# Patient Record
Sex: Female | Born: 1975 | Race: White | Hispanic: No | Smoking: Current every day smoker
Health system: Southern US, Community
[De-identification: ages and names within clinical notes are randomized; demographics above are authoritative.]

## PROBLEM LIST (undated history)

## (undated) DIAGNOSIS — G43909 Migraine, unspecified, not intractable, without status migrainosus: Secondary | ICD-10-CM

## (undated) DIAGNOSIS — I10 Essential (primary) hypertension: Secondary | ICD-10-CM

## (undated) DIAGNOSIS — F419 Anxiety disorder, unspecified: Secondary | ICD-10-CM

## (undated) DIAGNOSIS — E7800 Pure hypercholesterolemia, unspecified: Secondary | ICD-10-CM

## (undated) DIAGNOSIS — T7840XA Allergy, unspecified, initial encounter: Secondary | ICD-10-CM

## (undated) HISTORY — PX: CHOLECYSTECTOMY: SHX55

## (undated) HISTORY — DX: Anxiety disorder, unspecified: F41.9

## (undated) HISTORY — DX: Allergy, unspecified, initial encounter: T78.40XA

---

## 2005-01-01 ENCOUNTER — Emergency Department: Payer: Self-pay | Admitting: Emergency Medicine

## 2005-05-29 ENCOUNTER — Emergency Department: Payer: Self-pay | Admitting: Emergency Medicine

## 2005-07-13 ENCOUNTER — Ambulatory Visit: Payer: Self-pay

## 2005-07-21 ENCOUNTER — Emergency Department: Payer: Self-pay | Admitting: General Practice

## 2005-09-11 ENCOUNTER — Observation Stay: Payer: Self-pay | Admitting: Unknown Physician Specialty

## 2005-09-15 ENCOUNTER — Observation Stay: Payer: Self-pay | Admitting: Unknown Physician Specialty

## 2005-10-07 ENCOUNTER — Observation Stay: Payer: Self-pay | Admitting: Unknown Physician Specialty

## 2005-10-08 ENCOUNTER — Inpatient Hospital Stay: Payer: Self-pay | Admitting: Unknown Physician Specialty

## 2006-06-12 ENCOUNTER — Emergency Department: Payer: Self-pay

## 2006-06-16 ENCOUNTER — Emergency Department: Payer: Self-pay | Admitting: Emergency Medicine

## 2006-06-17 ENCOUNTER — Ambulatory Visit: Payer: Self-pay | Admitting: Emergency Medicine

## 2006-06-17 ENCOUNTER — Observation Stay: Payer: Self-pay | Admitting: Vascular Surgery

## 2006-06-17 ENCOUNTER — Emergency Department: Payer: Self-pay | Admitting: Unknown Physician Specialty

## 2006-07-02 ENCOUNTER — Ambulatory Visit: Payer: Self-pay | Admitting: Vascular Surgery

## 2007-09-21 ENCOUNTER — Emergency Department: Payer: Self-pay | Admitting: Emergency Medicine

## 2008-06-20 ENCOUNTER — Emergency Department: Payer: Self-pay | Admitting: Internal Medicine

## 2009-12-13 ENCOUNTER — Emergency Department: Payer: Self-pay | Admitting: Emergency Medicine

## 2009-12-31 ENCOUNTER — Emergency Department: Payer: Self-pay | Admitting: Unknown Physician Specialty

## 2011-10-01 ENCOUNTER — Emergency Department: Payer: Self-pay | Admitting: *Deleted

## 2011-10-01 LAB — URINALYSIS, COMPLETE
Glucose,UR: NEGATIVE mg/dL (ref 0–75)
Leukocyte Esterase: NEGATIVE
Ph: 7 (ref 4.5–8.0)
Protein: NEGATIVE
RBC,UR: 1 /HPF (ref 0–5)
Squamous Epithelial: 1

## 2011-10-01 LAB — CBC
HCT: 46 % (ref 35.0–47.0)
HGB: 15.1 g/dL (ref 12.0–16.0)
MCHC: 32.8 g/dL (ref 32.0–36.0)
MCV: 88 fL (ref 80–100)
RBC: 5.23 10*6/uL — ABNORMAL HIGH (ref 3.80–5.20)

## 2012-04-25 ENCOUNTER — Emergency Department: Payer: Self-pay | Admitting: Unknown Physician Specialty

## 2012-04-25 LAB — URINALYSIS, COMPLETE
Leukocyte Esterase: NEGATIVE
Nitrite: NEGATIVE
Ph: 5 (ref 4.5–8.0)
Protein: NEGATIVE
RBC,UR: 1 /HPF (ref 0–5)

## 2012-04-26 LAB — COMPREHENSIVE METABOLIC PANEL
Albumin: 3.2 g/dL — ABNORMAL LOW (ref 3.4–5.0)
Alkaline Phosphatase: 74 U/L (ref 50–136)
Anion Gap: 9 (ref 7–16)
BUN: 7 mg/dL (ref 7–18)
Bilirubin,Total: 0.3 mg/dL (ref 0.2–1.0)
Chloride: 109 mmol/L — ABNORMAL HIGH (ref 98–107)
Glucose: 100 mg/dL — ABNORMAL HIGH (ref 65–99)
Osmolality: 281 (ref 275–301)
Potassium: 3.3 mmol/L — ABNORMAL LOW (ref 3.5–5.1)
SGOT(AST): 19 U/L (ref 15–37)
Sodium: 142 mmol/L (ref 136–145)
Total Protein: 7 g/dL (ref 6.4–8.2)

## 2012-04-26 LAB — CBC
HGB: 11.9 g/dL — ABNORMAL LOW (ref 12.0–16.0)
MCH: 30.1 pg (ref 26.0–34.0)
MCV: 87 fL (ref 80–100)
Platelet: 139 10*3/uL — ABNORMAL LOW (ref 150–440)
RBC: 3.95 10*6/uL (ref 3.80–5.20)
RDW: 13.3 % (ref 11.5–14.5)

## 2012-06-13 ENCOUNTER — Observation Stay: Payer: Self-pay

## 2012-06-13 LAB — URINALYSIS, COMPLETE
Blood: NEGATIVE
Nitrite: NEGATIVE
Protein: NEGATIVE
Specific Gravity: 1.012 (ref 1.003–1.030)

## 2012-08-06 ENCOUNTER — Emergency Department: Payer: Self-pay | Admitting: Emergency Medicine

## 2012-08-23 ENCOUNTER — Observation Stay: Payer: Self-pay | Admitting: Obstetrics and Gynecology

## 2012-08-23 ENCOUNTER — Inpatient Hospital Stay: Payer: Self-pay | Admitting: Internal Medicine

## 2012-08-23 LAB — CBC WITH DIFFERENTIAL/PLATELET
Basophil #: 0 10*3/uL (ref 0.0–0.1)
Eosinophil #: 0.1 10*3/uL (ref 0.0–0.7)
Eosinophil %: 0.6 %
HCT: 32.4 % — ABNORMAL LOW (ref 35.0–47.0)
HGB: 10.9 g/dL — ABNORMAL LOW (ref 12.0–16.0)
Lymphocyte #: 3 10*3/uL (ref 1.0–3.6)
Lymphocyte %: 25.2 %
MCHC: 33.6 g/dL (ref 32.0–36.0)
Monocyte %: 5.8 %
Neutrophil #: 8 10*3/uL — ABNORMAL HIGH (ref 1.4–6.5)
Neutrophil %: 68.2 %
RBC: 3.71 10*6/uL — ABNORMAL LOW (ref 3.80–5.20)
RDW: 13.2 % (ref 11.5–14.5)
WBC: 11.7 10*3/uL — ABNORMAL HIGH (ref 3.6–11.0)

## 2012-08-25 LAB — HEMATOCRIT: HCT: 31.8 % — ABNORMAL LOW (ref 35.0–47.0)

## 2014-12-25 NOTE — Op Note (Signed)
PATIENT NAME:  Crystal Freeman, Crystal MR#:  [id number] DATE OF BIRTH:  1976-05-04  DATE OF PROCEDURE:  08/23/2012   PREOPERATIVE DIAGNOSES:   1.  Prior cesarean section.  2.  Spontaneous rupture of membranes,   POSTOPERATIVE DIAGNOSES: 1.  Prior caesarean section. 2.  Spontaneous rupture of membranes.  PROCEDURE PERFORMED: 1.  Repeat low transverse cesarean section. 2.  Bilateral partial salpingectomy.   SURGEON: Loman Logan A. Patton SallesWeaver Lee, M.D.   ASSISTANT: Creed CopperJamie Doby, scrub tech.   ESTIMATED BLOOD LOSS: 500 mL.  OPERATIVE FLUIDS: 2 liters.   COMPLICATIONS: None.   FINDINGS: Vertex female infant, 3430 grams, Apgars 9 and 9, normal uterus, tubes, and ovaries.   SPECIMEN: Portion of right and left tube.   INDICATIONS: The patient is a 39 year old with a history of prior cesarean sections who presents with spontaneous rupture of membranes.  The decision was made to proceed towards repeat cesarean section for delivery. Risks, benefits and alternatives of the procedure were explained and informed consent was obtained.   PROCEDURE IN DETAIL: The patient was taken to the Operating Room with IV fluids running. She was prepped and draped in the usual sterile fashion with a leftward tilt. Pfannenstiel skin incision, carried down to the umbilical fascia with a knife. The fascia was nicked in the midline. The incision was extended laterally. The superior aspect of the fascia was grasped with Kochers and the underlying rectus muscles were dissected off. This was repeated on the inferior fascia. The rectus muscles were divided midline using a Kelly clamp. The peritoneum was entered bluntly. The opening was extended. A bladder blade was placed and the vesicouterine peritoneum was grasped with a rat-tooth pick-up and entered sharply with the Metzenbaum scissors. The bladder flap was created digitally. The hysterotomy incision was made and carried down to underlying fetal membranes which were ruptured. The opening  was extended. The infant's head was grasped and delivered atraumatically through the hysterotomy incision. Nuchal cord x 1 was reduced. The mouth and nose were bulb suctioned. The anterior and posterior shoulders were delivered followed by the remainder of the body. Cord was clamped x 2 and cut and the infant was handed to the awaiting neonatologist. The placenta was expressed. The uterus was exteriorized and cleared of all clot and debris. The hysterotomy incision was repaired with a 0-Monocryl in a running locked fashion. The left tube was grasped with a Babcock and then an opening made in the avascular window in the mesosalpinx using the Bovie cautery. Two pieces of plain gut suture were passed through the opening. The tube was tied 2 to 3 cm lateral to the uterine cornu. The tube was cut. The cut edges were made hemostatic. This was repeated on the right tube. The uterus was returned to the abdomen. The abdomen and gutters were irrigated with copious amounts of warm normal saline. The peritoneum was repaired with 2-0 Vicryl. The On-Q apparatus was placed according to manufacturer's instructions and the fascia was repaired with 1-PDS.  The skin was closed with the Insorb stapler.  The On-Q pump catheters were bolused with 5 mL each  0.5% Sensorcaine plain. The catheters were secured to the abdomen using SteriStrips and Tegaderm. The patient tolerated the procedure well. Sponge, needle and instrument counts were correct x 2. The patient was taken to the recovery room in stable condition.  ____________________________ Sonda PrimesLashawn A. Patton SallesWeaver-Lee, MD law:eg D: 08/24/2012 01:12:20 ET T: 08/25/2012 15:59:31 ET JOB#: [id number]  cc: Flint MelterLashawn A. Patton SallesWeaver-Lee, MD, <Dictator> Lummie Montijo A WEAVER LEE  MD ELECTRONICALLY SIGNED 09/14/2012 4:35

## 2015-01-12 NOTE — H&P (Signed)
L&D Evaluation:  History:   HPI 39 yo G3P1011 @ 37.2wks EDC 09/11/12 by LMP presents with LOF.  She was seen earlier today for the same and was nitrazine neg.  About 4 hrs after d/c, she had a slow trickle then a gush of fluid.  Pt has had a prev C-section.  Pregnancy otherwise uncomplicated.  Nitrazine was pos.    Presents with leaking fluid    Patient's Medical History No Chronic Illness    Patient's Surgical History Colecystectomy  Previous C-Section  Hernia repair    Medications Pre Natal Vitamins    Allergies Sulfa    Family History Non-Contributory   ROS:   ROS see HPI, all others neg   Exam:   Vital Signs stable    Urine Protein not completed    General no apparent distress    Mental Status clear    Chest clear    Heart normal sinus rhythm    Abdomen gravid, non-tender    Edema no edema    Pelvic +pool, nitrazine and fern    Mebranes Ruptured    Description clear    FHT normal rate with no decels    Fetal Heart Rate 135    Ucx irregular    Skin dry   Impression:   Impression SROM @ term, prev C-section, desires sterility   Plan:   Comments Repeat C-section when labs return.  Risks discussed.  Pt desires tubal.   Electronic Signatures: Senaida LangeWeaver-Lee, Yarlin Breisch (MD)  (Signed 20-Dec-13 22:51)  Authored: L&D Evaluation   Last Updated: 20-Dec-13 22:51 by Senaida LangeWeaver-Lee, Fenris Cauble (MD)

## 2015-01-12 NOTE — H&P (Signed)
L&D Evaluation:  History Expanded:   HPI 39 yo G3P1011 with EDD of 09/11/12 per LMP & 8 week US, presents at 27 2/7 weeks with c/o vomiting x 6 overnight and this am and occasional abdominal pain. Pt last tried po intake of water about 5:30 this am after which she vomited. Has not had any more emesis or tried po again since then. Denies ROM, VB or decreased FM.    Blood Type (Maternal) A positive    Group B Strep Results Maternal (Result >5wks must be treated as unknown) unknown/result > 5 weeks ago    Maternal HIV Negative    Maternal Syphilis Ab Nonreactive    Maternal Varicella Immune    Rubella Results (Maternal) immune    Patient's Medical History No Chronic Illness    Patient's Surgical History Colecystectomy    Medications Pre Natal Vitamins    Allergies Sulfa    Social History tobacco   ROS:   ROS see HPI   Exam:   Vital Signs stable    General no apparent distress    Mental Status clear    Chest clear    Heart no murmur/gallop/rubs    Abdomen gravid, non-tender    Edema no edema    Pelvic no external lesions, cervix closed and thick    Mebranes Intact    FHT normal rate with no decels, appropriate for gestational age    Ucx absent   Impression:   Impression N/V   Plan:   Plan UA    Comments IV fluids IV zofran and phenergan Po trial when tolerated   Electronic Signatures: Jaece Ducharme, Marta Lamasamara K (CNM)  (Signed 10-Oct-13 12:39)  Authored: L&D Evaluation   Last Updated: 10-Oct-13 12:39 by Vella KohlerBrothers, Ishitha Roper K (CNM)

## 2019-08-08 ENCOUNTER — Ambulatory Visit
Admission: EM | Admit: 2019-08-08 | Discharge: 2019-08-08 | Disposition: A | Discharge status: Home / Self Care | Payer: BC Managed Care – PPO | Attending: Family Medicine | Admitting: Family Medicine

## 2019-08-08 ENCOUNTER — Ambulatory Visit (INDEPENDENT_AMBULATORY_CARE_PROVIDER_SITE_OTHER): Payer: BC Managed Care – PPO

## 2019-08-08 ENCOUNTER — Other Ambulatory Visit: Payer: Self-pay

## 2019-08-08 ENCOUNTER — Encounter: Payer: Self-pay | Admitting: Emergency Medicine

## 2019-08-08 DIAGNOSIS — M545 Low back pain: Secondary | ICD-10-CM

## 2019-08-08 DIAGNOSIS — S39012A Strain of muscle, fascia and tendon of lower back, initial encounter: Secondary | ICD-10-CM | POA: Diagnosis not present

## 2019-08-08 HISTORY — DX: Migraine, unspecified, not intractable, without status migrainosus: G43.909

## 2019-08-08 MED ORDER — CYCLOBENZAPRINE HCL 10 MG PO TABS: 10.0000 mg | ORAL_TABLET | Freq: Every day | ORAL | 0 refills | Status: DC

## 2019-08-08 MED ORDER — MELOXICAM 15 MG PO TABS: 15.0000 mg | ORAL_TABLET | Freq: Every day | ORAL | 0 refills | Status: DC

## 2019-08-08 MED ORDER — HYDROCODONE-ACETAMINOPHEN 5-325 MG PO TABS: ORAL_TABLET | ORAL | 0 refills | Status: DC
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
## 2019-08-08 NOTE — Discharge Instructions (Addendum)
Rest,heat, easy gentle stretching, tylenol as needed
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
## 2021-11-29 ENCOUNTER — Ambulatory Visit: Payer: Self-pay | Admitting: Internal Medicine

## 2021-12-09 ENCOUNTER — Encounter: Payer: Self-pay | Admitting: Internal Medicine

## 2021-12-09 ENCOUNTER — Ambulatory Visit (INDEPENDENT_AMBULATORY_CARE_PROVIDER_SITE_OTHER): Payer: BC Managed Care – PPO | Admitting: Internal Medicine

## 2021-12-09 DIAGNOSIS — F411 Generalized anxiety disorder: Secondary | ICD-10-CM | POA: Diagnosis not present

## 2021-12-09 DIAGNOSIS — E66811 Obesity, class 1: Secondary | ICD-10-CM | POA: Insufficient documentation

## 2021-12-09 DIAGNOSIS — R030 Elevated blood-pressure reading, without diagnosis of hypertension: Secondary | ICD-10-CM | POA: Insufficient documentation

## 2021-12-09 DIAGNOSIS — E6609 Other obesity due to excess calories: Secondary | ICD-10-CM | POA: Diagnosis not present

## 2021-12-09 DIAGNOSIS — Z6831 Body mass index (BMI) 31.0-31.9, adult: Secondary | ICD-10-CM

## 2021-12-09 DIAGNOSIS — G43909 Migraine, unspecified, not intractable, without status migrainosus: Secondary | ICD-10-CM | POA: Insufficient documentation

## 2021-12-09 DIAGNOSIS — G43C1 Periodic headache syndromes in child or adult, intractable: Secondary | ICD-10-CM

## 2021-12-09 MED ORDER — SUMATRIPTAN SUCCINATE 25 MG PO TABS: ORAL_TABLET | ORAL | 5 refills | Status: DC

## 2021-12-09 NOTE — Assessment & Plan Note (Signed)
Encourage diet and exercise for weight loss 

## 2021-12-09 NOTE — Assessment & Plan Note (Signed)
Try to identify triggers and avoid them ?Continue Imitrex as needed, refilled today ?

## 2021-12-09 NOTE — Assessment & Plan Note (Signed)
Persistent but she is not interested in medication therapy at this time ?She declines referral to a therapist as well at this time ?Support offered ?

## 2021-12-09 NOTE — Patient Instructions (Signed)

## 2021-12-09 NOTE — Progress Notes (Signed)
HPI ? ?Patient presents to clinic today to establish care and for management of the conditions listed below. ? ?Migraines: Triggered by hormones, lack of caffeine, periods of time without eating.  These occur rarely.  She is taking Imitrex as needed with good relief of symptoms.  She does not follow with neurology. ? ?GAD: Triggered by dealing with her ex husband, and she has been dealing with some child custody. She is not currently seeing a therapist but she has seen one in the past. She does not feel depressed. She has been on Fluoxetine in the past which worked well but she would like to try to stay off medication if she could. She denies SI/HI. ? ?Of note, her BP today is 136/92. She has never been told she has HTN in the past. She feels like this is related to stress and anxiety. She has never taken antihypertensive medications in the past. ? ?Past Medical History:  ?Diagnosis Date  ? Migraine   ? ? ?Current Outpatient Medications  ?Medication Sig Dispense Refill  ? cyclobenzaprine (FLEXERIL) 10 MG tablet Take 1 tablet (10 mg total) by mouth at bedtime. 30 tablet 0  ? HYDROcodone-acetaminophen (NORCO/VICODIN) 5-325 MG tablet 1-2 tabs po qd prn 6 tablet 0  ? meloxicam (MOBIC) 15 MG tablet Take 1 tablet (15 mg total) by mouth daily. 30 tablet 0  ? naproxen (NAPROSYN) 500 MG tablet Take 500 mg by mouth 2 (two) times daily as needed for headache.    ? norethindrone (AYGESTIN) 5 MG tablet Take 5 mg by mouth daily.    ? SUMAtriptan (IMITREX) 25 MG tablet TAKE 1 TABLET BY MOUTH ONCE AS NEEDED FOR MIGRAINE FOR UP TO 1 DOSE MAY TAKE SECOND DOSE AFTER 2 HOURS IF NEEDED.    ? ?No current facility-administered medications for this visit.  ? ? ?Allergies  ?Allergen Reactions  ? Statins   ?  Other reaction(s): Muscle Pain  ? Sulfa Antibiotics Rash  ? ? ?Family History  ?Problem Relation Age of Onset  ? COPD Mother   ? Heart failure Mother   ? Diabetes Mother   ? Hypertension Mother   ? Hyperlipidemia Mother   ? Hypertension  Father   ? Hyperlipidemia Father   ? ? ?Social History  ? ?Socioeconomic History  ? Marital status: Divorced  ?  Spouse name: Not on file  ? Number of children: Not on file  ? Years of education: Not on file  ? Highest education level: Not on file  ?Occupational History  ? Not on file  ?Tobacco Use  ? Smoking status: Every Day  ?  Packs/day: 0.75  ?  Years: 20.00  ?  Pack years: 15.00  ?  Types: Cigarettes  ? Smokeless tobacco: Never  ?Vaping Use  ? Vaping Use: Never used  ?Substance and Sexual Activity  ? Alcohol use: Never  ? Drug use: Never  ? Sexual activity: Not Currently  ?Other Topics Concern  ? Not on file  ?Social History Narrative  ? Not on file  ? ?Social Determinants of Health  ? ?Financial Resource Strain: Not on file  ?Food Insecurity: Not on file  ?Transportation Needs: Not on file  ?Physical Activity: Not on file  ?Stress: Not on file  ?Social Connections: Not on file  ?Intimate Partner Violence: Not on file  ? ? ?ROS: ? ?Constitutional: Patient reports intermittent headaches.  Denies fever, malaise, fatigue, or abrupt weight changes.  ?HEENT: Denies eye pain, eye redness, ear pain, ringing in  the ears, wax buildup, runny nose, nasal congestion, bloody nose, or sore throat. ?Respiratory: Denies difficulty breathing, shortness of breath, cough or sputum production.   ?Cardiovascular: Denies chest pain, chest tightness, palpitations or swelling in the hands or feet.  ?Gastrointestinal: Denies abdominal pain, bloating, constipation, diarrhea or blood in the stool.  ?GU: Denies frequency, urgency, pain with urination, blood in urine, odor or discharge. ?Musculoskeletal: Denies decrease in range of motion, difficulty with gait, muscle pain or joint pain and swelling.  ?Skin: Denies redness, rashes, lesions or ulcercations.  ?Neurological: Denies dizziness, difficulty with memory, difficulty with speech or problems with balance and coordination.  ?Psych: Pt reports anxiety. Denies depression, SI/HI. ? ?No  other specific complaints in a complete review of systems (except as listed in HPI above). ? ?PE: ?BP (!) 136/92 (BP Location: Right Arm, Patient Position: Sitting, Cuff Size: Large)   Pulse 85   Temp (!) 97.5 ?F (36.4 ?C) (Temporal)   Ht 5\' 7"  (1.702 m)   Wt 201 lb (91.2 kg)   SpO2 99%   BMI 31.48 kg/m?  ? ?Wt Readings from Last 3 Encounters:  ?08/08/19 183 lb (83 kg)  ? ? ?General: Appears her stated age, obese, in NAD. ?HEENT: Head: normal shape and size; Eyes: sclera white,  PERRLA and EOMs intact;  ?Cardiovascular: Normal rate and rhythm. S1,S2 noted.  No murmur, rubs or gallops noted. No JVD or BLE edema. ?Pulmonary/Chest: Normal effort and positive vesicular breath sounds. No respiratory distress. No wheezes, rales or ronchi noted.  ?Musculoskeletal: No difficulty with gait.  ?Neurological: Alert and oriented.  ?Psychiatric: Mood and affect normal. Tearful. Judgment and thought content normal.  ? ? ? ?BMET ?   ?Component Value Date/Time  ? NA 142 04/26/2012 0123  ? K 3.3 (L) 04/26/2012 0123  ? CL 109 (H) 04/26/2012 0123  ? CO2 24 04/26/2012 0123  ? GLUCOSE 100 (H) 04/26/2012 0123  ? BUN 7 04/26/2012 0123  ? CREATININE 0.68 04/26/2012 0123  ? CALCIUM 9.3 04/26/2012 0123  ? GFRNONAA >60 04/26/2012 0123  ? GFRAA >60 04/26/2012 0123  ? ? ?Lipid Panel  ?No results found for: CHOL, TRIG, HDL, CHOLHDL, VLDL, LDLCALC ? ?CBC ?   ?Component Value Date/Time  ? WBC 11.7 (H) 08/23/2012 2248  ? RBC 3.71 (L) 08/23/2012 2248  ? HGB 10.9 (L) 08/23/2012 2248  ? HCT 31.8 (L) 08/25/2012 0715  ? PLT 148 (L) 08/23/2012 2248  ? MCV 88 08/23/2012 2248  ? MCH 29.4 08/23/2012 2248  ? MCHC 33.6 08/23/2012 2248  ? RDW 13.2 08/23/2012 2248  ? LYMPHSABS 3.0 08/23/2012 2248  ? MONOABS 0.7 08/23/2012 2248  ? EOSABS 0.1 08/23/2012 2248  ? BASOSABS 0.0 08/23/2012 2248  ? ? ?Hgb A1C ?No results found for: HGBA1C ? ? ?Assessment and Plan: ? ? ?08/25/2012, NP ? ?

## 2021-12-09 NOTE — Assessment & Plan Note (Signed)
Elevated today but she feels this is stress related ?Reinforced DASH diet and exercise for weight loss ? ?RTC in 1 month for annual exam and BP check ?

## 2022-01-06 ENCOUNTER — Encounter: Payer: BC Managed Care – PPO | Admitting: Internal Medicine

## 2022-01-19 ENCOUNTER — Ambulatory Visit (INDEPENDENT_AMBULATORY_CARE_PROVIDER_SITE_OTHER): Payer: BC Managed Care – PPO | Admitting: Internal Medicine

## 2022-01-19 ENCOUNTER — Other Ambulatory Visit (HOSPITAL_COMMUNITY)
Admission: RE | Admit: 2022-01-19 | Discharge: 2022-01-19 | Disposition: A | Discharge status: Home / Self Care | Payer: BC Managed Care – PPO | Source: Ambulatory Visit | Attending: Internal Medicine | Admitting: Internal Medicine

## 2022-01-19 ENCOUNTER — Encounter: Payer: Self-pay | Admitting: Internal Medicine

## 2022-01-19 VITALS — BP 138/86 | HR 87 | Temp 97.8°F | Ht 67.0 in | Wt 200.0 lb

## 2022-01-19 DIAGNOSIS — Z6831 Body mass index (BMI) 31.0-31.9, adult: Secondary | ICD-10-CM

## 2022-01-19 DIAGNOSIS — Z0001 Encounter for general adult medical examination with abnormal findings: Secondary | ICD-10-CM

## 2022-01-19 DIAGNOSIS — Z124 Encounter for screening for malignant neoplasm of cervix: Secondary | ICD-10-CM | POA: Diagnosis not present

## 2022-01-19 DIAGNOSIS — Z1159 Encounter for screening for other viral diseases: Secondary | ICD-10-CM

## 2022-01-19 DIAGNOSIS — Z1231 Encounter for screening mammogram for malignant neoplasm of breast: Secondary | ICD-10-CM

## 2022-01-19 DIAGNOSIS — Z114 Encounter for screening for human immunodeficiency virus [HIV]: Secondary | ICD-10-CM | POA: Diagnosis not present

## 2022-01-19 DIAGNOSIS — E6609 Other obesity due to excess calories: Secondary | ICD-10-CM

## 2022-01-19 DIAGNOSIS — R030 Elevated blood-pressure reading, without diagnosis of hypertension: Secondary | ICD-10-CM

## 2022-01-19 NOTE — Assessment & Plan Note (Signed)
Borderline Reinforced DASH diet and exercise for weight loss 

## 2022-01-19 NOTE — Assessment & Plan Note (Signed)
Encourage diet and exercise for weight loss 

## 2022-01-19 NOTE — Progress Notes (Signed)
Subjective:    Patient ID: Crystal Freeman, female    DOB: Apr 24, 1976, 46 y.o.   MRN: [id number]  HPI  Patient presents to clinic today for her annual exam.  Flu: 06/2021 Tetanus: 06/2017 Pneumovax: 07/2017 COVID: Alphonsa Overall x1 Pap smear: > 5 years ago Mammogram: never Vision screening: as needed Dentist: biannually  Diet: She does eat meat. She consumes fruits and veggies. She tries to avoid fried foods. She drinks some water, coffee, sweet tea Exercise: None  Review of Systems     Past Medical History:  Diagnosis Date   Migraine     Current Outpatient Medications  Medication Sig Dispense Refill   acetaminophen (TYLENOL) 500 MG tablet Take by mouth.     norethindrone (AYGESTIN) 5 MG tablet Take 5 mg by mouth daily.     SUMAtriptan (IMITREX) 25 MG tablet TAKE 1 TABLET BY MOUTH ONCE AS NEEDED FOR MIGRAINE FOR UP TO 1 DOSE MAY TAKE SECOND DOSE AFTER 2 HOURS IF NEEDED. 10 tablet 5   No current facility-administered medications for this visit.    Allergies  Allergen Reactions   Statins     Other reaction(s): Muscle Pain   Sulfa Antibiotics Rash    Family History  Problem Relation Age of Onset   COPD Mother    Heart failure Mother    Diabetes Mother    Hypertension Mother    Depression Mother    Hypertension Father    Hyperlipidemia Father    Healthy Sister    Cancer - Colon Neg Hx    Breast cancer Neg Hx    Ovarian cancer Neg Hx     Social History   Socioeconomic History   Marital status: Divorced    Spouse name: Not on file   Number of children: Not on file   Years of education: Not on file   Highest education level: Not on file  Occupational History   Not on file  Tobacco Use   Smoking status: Every Day    Packs/day: 0.75    Years: 20.00    Pack years: 15.00    Types: Cigarettes   Smokeless tobacco: Never  Vaping Use   Vaping Use: Never used  Substance and Sexual Activity   Alcohol use: Never   Drug use: Never   Sexual activity: Not  Currently  Other Topics Concern   Not on file  Social History Narrative   Not on file   Social Determinants of Health   Financial Resource Strain: Not on file  Food Insecurity: Not on file  Transportation Needs: Not on file  Physical Activity: Not on file  Stress: Not on file  Social Connections: Not on file  Intimate Partner Violence: Not on file     Constitutional: Patient reports intermittent headaches.  Denies fever, malaise, fatigue, or abrupt weight changes.  HEENT: Denies eye pain, eye redness, ear pain, ringing in the ears, wax buildup, runny nose, nasal congestion, bloody nose, or sore throat. Respiratory: Denies difficulty breathing, shortness of breath, cough or sputum production.   Cardiovascular: Denies chest pain, chest tightness, palpitations or swelling in the hands or feet.  Gastrointestinal: Denies abdominal pain, bloating, constipation, diarrhea or blood in the stool.  GU: Denies urgency, frequency, pain with urination, burning sensation, blood in urine, odor or discharge. Musculoskeletal: Denies decrease in range of motion, difficulty with gait, muscle pain or joint pain and swelling.  Skin: Denies redness, rashes, lesions or ulcercations.  Neurological: Denies dizziness, difficulty with memory, difficulty with speech  or problems with balance and coordination.  Psych: Patient reports anxiety.  Denies depression, SI/HI.  No other specific complaints in a complete review of systems (except as listed in HPI above).  Objective:   Physical Exam  BP 138/86 (BP Location: Left Arm, Patient Position: Sitting, Cuff Size: Large)   Pulse 87   Temp 97.8 F (36.6 C) (Temporal)   Ht 5' 7" (1.702 m)   Wt 200 lb (90.7 kg)   SpO2 100%   BMI 31.32 kg/m   Wt Readings from Last 3 Encounters:  12/09/21 201 lb (91.2 kg)  08/08/19 183 lb (83 kg)    General: Appears her stated age, obese, in NAD. Skin: Warm, dry and intact.  HEENT: Head: normal shape and size; Eyes:  sclera white, no icterus, conjunctiva pink, PERRLA and EOMs intact;  Neck:  Neck supple, trachea midline. No masses, lumps or thyromegaly present.  Cardiovascular: Normal rate and rhythm. S1,S2 noted.  No murmur, rubs or gallops noted. No JVD or BLE edema. Pulmonary/Chest: Normal effort and positive vesicular breath sounds. No respiratory distress. No wheezes, rales or ronchi noted.  Abdomen: Soft and nontender. Normal bowel sounds. No distention or masses noted. Liver, spleen and kidneys non palpable. Pelvic: Normal female anatomy.  Cervix with reoperative changes noted from 10-12 o'clock.  Small amount of yellow discharge noted from the cervical os.  No CMT.  Adnexa nonpalpable. Musculoskeletal: Strength 5/5 BUE/BLE.  No difficulty with gait.  Neurological: Alert and oriented. Cranial nerves II-XII grossly intact. Coordination normal.  Psychiatric: Mood and affect normal. Behavior is normal. Judgment and thought content normal.    BMET    Component Value Date/Time   NA 142 04/26/2012 0123   K 3.3 (L) 04/26/2012 0123   CL 109 (H) 04/26/2012 0123   CO2 24 04/26/2012 0123   GLUCOSE 100 (H) 04/26/2012 0123   BUN 7 04/26/2012 0123   CREATININE 0.68 04/26/2012 0123   CALCIUM 9.3 04/26/2012 0123   GFRNONAA >60 04/26/2012 0123   GFRAA >60 04/26/2012 0123    Lipid Panel  No results found for: CHOL, TRIG, HDL, CHOLHDL, VLDL, LDLCALC  CBC    Component Value Date/Time   WBC 11.7 (H) 08/23/2012 2248   RBC 3.71 (L) 08/23/2012 2248   HGB 10.9 (L) 08/23/2012 2248   HCT 31.8 (L) 08/25/2012 0715   PLT 148 (L) 08/23/2012 2248   MCV 88 08/23/2012 2248   MCH 29.4 08/23/2012 2248   MCHC 33.6 08/23/2012 2248   RDW 13.2 08/23/2012 2248   LYMPHSABS 3.0 08/23/2012 2248   MONOABS 0.7 08/23/2012 2248   EOSABS 0.1 08/23/2012 2248   BASOSABS 0.0 08/23/2012 2248    Hgb A1C No results found for: HGBA1C          Assessment & Plan:   Preventative Health Maintenance:  Encouraged her to get  a flu shot in fall Tetanus UTD Pneumovax UTD Encouraged her to get a COVID booster Pap smear Mammogram ordered-she will call to schedule Encouraged her to consume a balanced diet and exercise regimen Advised him to see an eye doctor and dentist annually We will check CBC, c-Met, lipid, A1c, HIV and hep C today  RTC in 6 months, follow-up chronic conditions Regina Baity, NP  

## 2022-01-19 NOTE — Patient Instructions (Signed)

## 2022-01-20 ENCOUNTER — Telehealth: Payer: Self-pay

## 2022-01-20 DIAGNOSIS — E782 Mixed hyperlipidemia: Secondary | ICD-10-CM

## 2022-01-20 NOTE — Telephone Encounter (Signed)
-----   Message from Crystal Munroe, NP sent at 01/20/2022 10:45 AM EDT ----- WBC count, RBC count, hemoglobin and hematocrit are elevated.  This is likely due to her smoking and she should cut back on this.  The risk of this being increased is that it can lead to blood clots.  Cholesterol is elevated.  I would recommend cholesterol-lowering medication at this time.  Let me know if she is agreeable and I will send this in.  Reinforced low saturated fat diet and increase aerobic exercise.  Liver and kidney function is normal.  She does not have diabetes.

## 2022-01-20 NOTE — Telephone Encounter (Signed)
Pt advised.  She agreed to start a cholesterol medicine.  Please sent to [facility].   Thanks,   -Vernona Rieger

## 2022-01-22 NOTE — Telephone Encounter (Cosign Needed)
I see she has an allergy to statins. Just wanted to let her know that I was going to send in a statin. What statin has she tried in the past. Is she willing to try another one?

## 2022-01-23 ENCOUNTER — Encounter: Payer: Self-pay | Admitting: Internal Medicine

## 2022-01-23 LAB — COMPLETE METABOLIC PANEL WITH GFR
AG Ratio: 1.6 (calc) (ref 1.0–2.5)
ALT: 11 U/L (ref 6–29)
AST: 14 U/L (ref 10–30)
Albumin: 4.4 g/dL (ref 3.6–5.1)
Alkaline phosphatase (APISO): 63 U/L (ref 31–125)
BUN: 10 mg/dL (ref 7–25)
CO2: 25 mmol/L (ref 20–32)
Calcium: 9.1 mg/dL (ref 8.6–10.2)
Chloride: 107 mmol/L (ref 98–110)
Creat: 0.92 mg/dL (ref 0.50–0.99)
Globulin: 2.7 g/dL (calc) (ref 1.9–3.7)
Glucose, Bld: 82 mg/dL (ref 65–99)
Potassium: 4.1 mmol/L (ref 3.5–5.3)
Sodium: 141 mmol/L (ref 135–146)
Total Bilirubin: 0.5 mg/dL (ref 0.2–1.2)
Total Protein: 7.1 g/dL (ref 6.1–8.1)
eGFR: 79 mL/min/{1.73_m2} (ref >=60)

## 2022-01-23 LAB — HEMOGLOBIN A1C
Hgb A1c MFr Bld: 5.3 % of total Hgb (ref <5.7)
Mean Plasma Glucose: 105 mg/dL
eAG (mmol/L): 5.8 mmol/L

## 2022-01-23 LAB — LIPID PANEL
Cholesterol: 208 mg/dL — ABNORMAL HIGH (ref <200)
HDL: 25 mg/dL — ABNORMAL LOW (ref >=50)
LDL Cholesterol (Calc): 158 mg/dL (calc) — ABNORMAL HIGH
Non-HDL Cholesterol (Calc): 183 mg/dL (calc) — ABNORMAL HIGH (ref <130)
Total CHOL/HDL Ratio: 8.3 (calc) — ABNORMAL HIGH (ref <5.0)
Triglycerides: 129 mg/dL (ref <150)

## 2022-01-23 LAB — CBC
HCT: 49.1 % — ABNORMAL HIGH (ref 35.0–45.0)
Hemoglobin: 16.6 g/dL — ABNORMAL HIGH (ref 11.7–15.5)
MCH: 30.6 pg (ref 27.0–33.0)
MCHC: 33.8 g/dL (ref 32.0–36.0)
MCV: 90.4 fL (ref 80.0–100.0)
MPV: 11.9 fL (ref 7.5–12.5)
Platelets: 188 10*3/uL (ref 140–400)
RBC: 5.43 10*6/uL — ABNORMAL HIGH (ref 3.80–5.10)
RDW: 12.4 % (ref 11.0–15.0)
WBC: 11.2 10*3/uL — ABNORMAL HIGH (ref 3.8–10.8)

## 2022-01-23 LAB — HEPATITIS C ANTIBODY
Hepatitis C Ab: NONREACTIVE
SIGNAL TO CUT-OFF: 0.18 (ref <1.00)

## 2022-01-23 LAB — HIV ANTIBODY (ROUTINE TESTING W REFLEX): HIV 1&2 Ab, 4th Generation: NONREACTIVE

## 2022-01-23 NOTE — Telephone Encounter (Signed)
See telephone encounter   Thanks,   -Kizzi Overbey  

## 2022-01-23 NOTE — Telephone Encounter (Signed)
Pt has thinks she tried pravastatin and simvastatin.  She is willing to try a different one.    Thanks,   -Vernona Rieger

## 2022-01-24 ENCOUNTER — Encounter: Payer: Self-pay | Admitting: Internal Medicine

## 2022-01-24 ENCOUNTER — Telehealth: Payer: Self-pay

## 2022-01-24 DIAGNOSIS — E782 Mixed hyperlipidemia: Secondary | ICD-10-CM | POA: Insufficient documentation

## 2022-01-24 DIAGNOSIS — R87612 Low grade squamous intraepithelial lesion on cytologic smear of cervix (LGSIL): Secondary | ICD-10-CM

## 2022-01-24 LAB — CYTOLOGY - PAP

## 2022-01-24 MED ORDER — ATORVASTATIN CALCIUM 10 MG PO TABS: 10.0000 mg | ORAL_TABLET | ORAL | 0 refills | Status: DC

## 2022-01-24 NOTE — Telephone Encounter (Signed)
Copied from CRM [id number]. Topic: General - Other >> Jan 24, 2022  1:38 PM Randol Kern wrote: Reason for CRM: Pt called requesting to discuss her PAP results. Please advise

## 2022-01-24 NOTE — Telephone Encounter (Signed)
Lets have her try atorvastatin 3 times weekly.  If she starts having intolerable symptoms, please have her let me know otherwise I would like to repeat her cholesterol in 3 months, lab only.  Please have her schedule a lab only appointment.

## 2022-01-24 NOTE — Telephone Encounter (Signed)
LMTCB 01/24/2022.  PEC please advise pt and schedule apt when she calls back.   Thanks,   -Vernona Rieger

## 2022-01-24 NOTE — Addendum Note (Signed)
Addended by: Jearld Fenton on: 01/24/2022 08:31 AM   Modules accepted: Orders

## 2022-01-27 ENCOUNTER — Ambulatory Visit: Payer: Self-pay

## 2022-01-27 ENCOUNTER — Telehealth: Payer: Self-pay | Admitting: Emergency Medicine

## 2022-01-27 DIAGNOSIS — M545 Low back pain, unspecified: Secondary | ICD-10-CM

## 2022-01-27 DIAGNOSIS — H1031 Unspecified acute conjunctivitis, right eye: Secondary | ICD-10-CM

## 2022-01-27 MED ORDER — CYCLOBENZAPRINE HCL 5 MG PO TABS: 5.0000 mg | ORAL_TABLET | Freq: Three times a day (TID) | ORAL | 0 refills | Status: DC | PRN

## 2022-01-27 MED ORDER — TOBRAMYCIN 0.3 % OP SOLN: 1.0000 [drp] | OPHTHALMIC | 0 refills | Status: DC

## 2022-01-27 NOTE — Patient Instructions (Signed)
  Crystal Freeman, thank you for joining Cathlyn Parsons, NP for today's virtual visit.  While this provider is not your primary care provider (PCP), if your PCP is located in our provider database this encounter information will be shared with them immediately following your visit.  Consent: (Patient) Crystal Freeman provided verbal consent for this virtual visit at the beginning of the encounter.  Current Medications:  Current Outpatient Medications:    cyclobenzaprine (FLEXERIL) 5 MG tablet, Take 1 tablet (5 mg total) by mouth 3 (three) times daily as needed for muscle spasms., Disp: 21 tablet, Rfl: 0   tobramycin (TOBREX) 0.3 % ophthalmic solution, Place 1 drop into the right eye every 4 (four) hours., Disp: 5 mL, Rfl: 0   acetaminophen (TYLENOL) 500 MG tablet, Take by mouth., Disp: , Rfl:    atorvastatin (LIPITOR) 10 MG tablet, Take 1 tablet (10 mg total) by mouth 3 (three) times a week., Disp: 36 tablet, Rfl: 0   norethindrone (AYGESTIN) 5 MG tablet, Take 5 mg by mouth daily., Disp: , Rfl:    SUMAtriptan (IMITREX) 25 MG tablet, TAKE 1 TABLET BY MOUTH ONCE AS NEEDED FOR MIGRAINE FOR UP TO 1 DOSE MAY TAKE SECOND DOSE AFTER 2 HOURS IF NEEDED., Disp: 10 tablet, Rfl: 5   Medications ordered in this encounter:  Meds ordered this encounter  Medications   tobramycin (TOBREX) 0.3 % ophthalmic solution    Sig: Place 1 drop into the right eye every 4 (four) hours.    Dispense:  5 mL    Refill:  0   cyclobenzaprine (FLEXERIL) 5 MG tablet    Sig: Take 1 tablet (5 mg total) by mouth 3 (three) times daily as needed for muscle spasms.    Dispense:  21 tablet    Refill:  0     *If you need refills on other medications prior to your next appointment, please contact your pharmacy*  Follow-Up: Call back or seek an in-person evaluation if the symptoms worsen or if the condition fails to improve as anticipated.  Other Instructions Use ibuprofen for pain per package instructions.  See attached  instructions for heat therapy.  Use warm compresses to your right eye several times a day to help relieve the pinkeye.  Use a clean washcloth each time.   If you have been instructed to have an in-person evaluation today at a local Urgent Care facility, please use the link below. It will take you to a list of all of our available [facility], including address, phone number and hours of operation. Please do not delay care.  [facility]  If you or a family member do not have a primary care provider, use the link below to schedule a visit and establish care. When you choose a [facility] primary care physician or advanced practice provider, you gain a long-term partner in health. Find a Primary Care Provider  Learn more about [facility]'s in-office and virtual care options: [facility] - Get Care Now

## 2022-01-27 NOTE — Progress Notes (Signed)
Virtual Visit Consent   Crystal Freeman, you are scheduled for a virtual visit with a [facility] provider today. Just as with appointments in the office, your consent must be obtained to participate. Your consent will be active for this visit and any virtual visit you may have with one of our providers in the next 365 days. If you have a MyChart account, a copy of this consent can be sent to you electronically.  As this is a virtual visit, video technology does not allow for your provider to perform a traditional examination. This may limit your provider's ability to fully assess your condition. If your provider identifies any concerns that need to be evaluated in person or the need to arrange testing (such as labs, EKG, etc.), we will make arrangements to do so. Although advances in technology are sophisticated, we cannot ensure that it will always work on either your end or our end. If the connection with a video visit is poor, the visit may have to be switched to a telephone visit. With either a video or telephone visit, we are not always able to ensure that we have a secure connection.  By engaging in this virtual visit, you consent to the provision of healthcare and authorize for your insurance to be billed (if applicable) for the services provided during this visit. Depending on your insurance coverage, you may receive a charge related to this service.  I need to obtain your verbal consent now. Are you willing to proceed with your visit today? Crystal Freeman has provided verbal consent on 01/27/2022 for a virtual visit (video or telephone). Carvel Getting, NP  Date: 01/27/2022 11:11 AM  Virtual Visit via Video Note   I, Carvel Getting, connected with  Crystal Freeman  ([id number], 02/01/1976) on 01/27/22 at 10:15 AM EDT by a video-enabled telemedicine application and verified that I am speaking with the correct person using two identifiers.  Location: Patient: Virtual Visit Location Patient:  Home Provider: Virtual Visit Location Provider: Home Office   I discussed the limitations of evaluation and management by telemedicine and the availability of in person appointments. The patient expressed understanding and agreed to proceed.    History of Present Illness: Crystal Freeman is a 46 y.o. who identifies as a female who was assigned female at birth, and is being seen today for 2 problems.  First, she woke up this morning with green purulent drainage from her right eye that is red/pink.  She reports she has been around a lot of pinkeye lately as she works in the [employer].  Denies vision change.  Denies wearing contacts or using eye make-up.  She denies nasal congestion or sinus pressure.  Separately she also complains of a pulled muscle in her back.  It is in her left middle lower back adjacent to the spine.  It feels achy and tight.  It was worse this morning when she woke up compared to last night, she feels like she got stiff overnight in bed.  She denies specific injury but has had similar symptoms before when she has pulled a muscle.  She denies numbness or weakness.  She denies pain that radiates down her legs.  She denies changes in her bowel or bladder habits.  No fever or chills.  HPI: HPI  Problems:  Patient Active Problem List   Diagnosis Date Noted   Mixed hyperlipidemia 01/24/2022   Elevated blood pressure reading without diagnosis of hypertension 12/09/2021   Migraines 12/09/2021  GAD (generalized anxiety disorder) 12/09/2021   Class 1 obesity due to excess calories with body mass index (BMI) of 31.0 to 31.9 in adult 12/09/2021    Allergies:  Allergies  Allergen Reactions   Statins     Other reaction(s): Muscle Pain   Sulfa Antibiotics Rash   Medications:  Current Outpatient Medications:    cyclobenzaprine (FLEXERIL) 5 MG tablet, Take 1 tablet (5 mg total) by mouth 3 (three) times daily as needed for muscle spasms., Disp: 21 tablet, Rfl: 0   tobramycin  (TOBREX) 0.3 % ophthalmic solution, Place 1 drop into the right eye every 4 (four) hours., Disp: 5 mL, Rfl: 0   acetaminophen (TYLENOL) 500 MG tablet, Take by mouth., Disp: , Rfl:    atorvastatin (LIPITOR) 10 MG tablet, Take 1 tablet (10 mg total) by mouth 3 (three) times a week., Disp: 36 tablet, Rfl: 0   norethindrone (AYGESTIN) 5 MG tablet, Take 5 mg by mouth daily., Disp: , Rfl:    SUMAtriptan (IMITREX) 25 MG tablet, TAKE 1 TABLET BY MOUTH ONCE AS NEEDED FOR MIGRAINE FOR UP TO 1 DOSE MAY TAKE SECOND DOSE AFTER 2 HOURS IF NEEDED., Disp: 10 tablet, Rfl: 5  Observations/Objective: Patient is well-developed, well-nourished in no acute distress.  Resting comfortably  at home.  Head is normocephalic, atraumatic.  No labored breathing.  Speech is clear and coherent with logical content.  Patient is alert and oriented at baseline.  I am not able to see conjunctiva sufficiently on video to determine if they are injected.  Assessment and Plan: 1. Acute bacterial conjunctivitis of right eye  2. Acute left-sided low back pain without sciatica  Given exposure to pinkeye, will treat for bacterial conjunctivitis.  Patient prefers to take her own ibuprofen at home for muscle strain.  We discussed proper use of heat therapy for muscle strain.  Prescribed Flexeril.  Follow Up Instructions: I discussed the assessment and treatment plan with the patient. The patient was provided an opportunity to ask questions and all were answered. The patient agreed with the plan and demonstrated an understanding of the instructions.  A copy of instructions were sent to the patient via MyChart unless otherwise noted below.   The patient was advised to call back or seek an in-person evaluation if the symptoms worsen or if the condition fails to improve as anticipated.  Time:  I spent 15 minutes with the patient via telehealth technology discussing the above problems/concerns.    Carvel Getting, NP

## 2022-01-27 NOTE — Telephone Encounter (Signed)
Summary: possible pink eye   Pt called in stating she thinks she may have pulled a muscle and might have pink eye, there were no available appts until next week, please advise.     Called pt - LMOM to return call.

## 2022-01-27 NOTE — Telephone Encounter (Signed)
Reason for Disposition  Yellow or green pus occurs  Answer Assessment - Initial Assessment Questions 1. ONSET: "When did the pain start?" (e.g., minutes, hours, days)     Right eye.  This morning woke up with green yellow mucus around it.   It's red.  It itches.    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    2. TIMING: "Does the pain come and go, or has it been constant since it started?" (e.g., constant, intermittent, fleeting)      3. SEVERITY: "How bad is the pain?"   (Scale 1-10; mild, moderate or severe)   - MILD (1-3): doesn't interfere with normal activities    - MODERATE (4-7): interferes with normal activities or awakens from sleep    - SEVERE (8-10): excruciating pain and patient unable to do normal activities     It's not watering or burning 4. LOCATION: "Where does it hurt?"  (e.g., eyelid, eye, cheekbone)      5. CAUSE: "What do you think is causing the pain?"     Pink eye 6. VISION: "Do you have blurred vision or changes in your vision?"      Right eye 7. EYE DISCHARGE: "Is there any discharge (pus) from the eye(s)?"  If yes, ask: "What color is it?"      See above 8. FEVER: "Do you have a fever?" If Yes, ask: "What is it, how was it measured, and when did it start?"      No 9. OTHER SYMPTOMS: "Do you have any other symptoms?" (e.g., headache, nasal discharge, facial rash)     No 10. PREGNANCY: "Is there any chance you are pregnant?" "When was your last menstrual period?"       Not asked  Protocols used: Eye Pain and Other Symptoms-A-AH  Chief Complaint: Possible pink eye.   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 Disposition: [] ED /[] Urgent Care (no appt availability in office) / [] Appointment(In office/virtual)/ [x]  [facility]/ [] Home Care/ [] Refused Recommended Disposition /[] [facility]/ []  Follow-up  with PCP Additional Notes: Scheduled a [facility] [place] virtual visit for today at 10:15 AM

## 2022-02-17 ENCOUNTER — Encounter: Payer: Self-pay | Admitting: Internal Medicine

## 2022-02-17 MED ORDER — NICOTINE 14 MG/24HR TD PT24: 14.0000 mg | MEDICATED_PATCH | Freq: Every day | TRANSDERMAL | 0 refills | Status: DC

## 2022-03-01 ENCOUNTER — Ambulatory Visit: Payer: Self-pay

## 2022-03-10 ENCOUNTER — Encounter: Payer: BC Managed Care – PPO | Admitting: Obstetrics and Gynecology

## 2022-03-13 NOTE — Progress Notes (Unsigned)
    GYNECOLOGY OFFICE COLPOSCOPY PROCEDURE NOTE  46 y.o. No obstetric history on file. 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 Discussed role for HPV in cervical dysplasia, need for surveillance.  Patient gave informed written consent, time out was performed.  Placed in lithotomy position. Cervix viewed with speculum and colposcope after application of acetic acid.   Colposcopy adequate? {yes/no:20286}  {Findings; colposcopy:728}; corresponding biopsies obtained.  ECC specimen obtained. All specimens were labeled and sent to pathology.  Chaperone was present during entire procedure.  Patient was given post procedure instructions.  Will follow up pathology and manage accordingly; patient will be contacted with results and recommendations.  Routine preventative health maintenance measures emphasized.    Hildred Laser, MD [facility]

## 2022-03-14 ENCOUNTER — Other Ambulatory Visit (HOSPITAL_COMMUNITY)
Admission: RE | Admit: 2022-03-14 | Discharge: 2022-03-14 | Disposition: A | Discharge status: Home / Self Care | Payer: BC Managed Care – PPO | Source: Ambulatory Visit | Attending: Obstetrics and Gynecology | Admitting: Obstetrics and Gynecology

## 2022-03-14 ENCOUNTER — Ambulatory Visit (INDEPENDENT_AMBULATORY_CARE_PROVIDER_SITE_OTHER): Payer: BC Managed Care – PPO | Admitting: Obstetrics and Gynecology

## 2022-03-14 ENCOUNTER — Encounter: Payer: Self-pay | Admitting: Obstetrics and Gynecology

## 2022-03-14 VITALS — BP 147/83 | HR 71 | Resp 16 | Ht 67.0 in | Wt 201.7 lb

## 2022-03-14 DIAGNOSIS — R87612 Low grade squamous intraepithelial lesion on cytologic smear of cervix (LGSIL): Secondary | ICD-10-CM | POA: Insufficient documentation

## 2022-03-14 NOTE — Patient Instructions (Signed)
Colposcopy, Care After  The following information offers guidance on how to care for yourself after your procedure. Your health care provider may also give you more specific instructions. If you have problems or questions, contact your health care provider. What can I expect after the procedure? If you had a colposcopy without a biopsy, you can expect to feel fine right away after your procedure. However, you may have some spotting of blood for a few days. You can return to your normal activities. If you had a colposcopy with a biopsy, it is common after the procedure to have: Soreness and mild pain. These may last for a few days. Mild vaginal bleeding or discharge that is dark-colored and grainy. This may last for a few days. The discharge may be caused by a liquid (solution) that was used during the procedure. You may need to wear a sanitary pad during this time. Spotting of blood for at least 48 hours after the procedure. Follow these instructions at home: Medicines Take over-the-counter and prescription medicines only as told by your health care provider. Talk with your health care provider about what type of over-the-counter pain medicines and prescription medicines you can start to take again. It is especially important to talk with your health care provider if you take blood thinners. Activity Avoid using douche products, using tampons, and having sex for at least 3 days after the procedure or for as long as told by your health care provider. Return to your normal activities as told by your health care provider. Ask your health care provider what activities are safe for you. General instructions Ask your health care provider if you may take baths, swim, or use a hot tub. You may take showers. If you use birth control (contraception), continue to use it. Keep all follow-up visits. This is important. Contact a health care provider if: You have a fever or chills. You faint or feel  light-headed. Get help right away if: You have heavy bleeding from your vagina or pass blood clots. Heavy bleeding is bleeding that soaks through a sanitary pad in less than 1 hour. You have vaginal discharge that is abnormal, is yellow in color, or smells bad. This could be a sign of infection. You have severe pain or cramps in your lower abdomen that do not go away with medicine. Summary If you had a colposcopy without a biopsy, you can expect to feel fine right away, but you may have some spotting of blood for a few days. You can return to your normal activities. If you had a colposcopy with a biopsy, it is common to have mild pain for a few days and spotting for 48 hours after the procedure. Avoid using douche products, using tampons, and having sex for at least 3 days after the procedure or for as long as told by your health care provider. Get help right away if you have heavy bleeding, severe pain, or signs of infection. This information is not intended to replace advice given to you by your health care provider. Make sure you discuss any questions you have with your health care provider. Document Revised: 01/16/2021 Document Reviewed: 01/16/2021 Elsevier Patient Education  2023 [employer]  

## 2022-03-16 LAB — SURGICAL PATHOLOGY

## 2022-03-27 ENCOUNTER — Ambulatory Visit
Admission: RE | Admit: 2022-03-27 | Discharge: 2022-03-27 | Disposition: A | Discharge status: Home / Self Care | Payer: BC Managed Care – PPO | Source: Ambulatory Visit | Attending: Internal Medicine | Admitting: Internal Medicine

## 2022-03-27 DIAGNOSIS — Z1231 Encounter for screening mammogram for malignant neoplasm of breast: Secondary | ICD-10-CM | POA: Insufficient documentation

## 2022-03-29 ENCOUNTER — Other Ambulatory Visit: Payer: Self-pay | Admitting: Internal Medicine

## 2022-03-29 DIAGNOSIS — R928 Other abnormal and inconclusive findings on diagnostic imaging of breast: Secondary | ICD-10-CM

## 2022-03-29 DIAGNOSIS — N63 Unspecified lump in unspecified breast: Secondary | ICD-10-CM

## 2022-03-29 DIAGNOSIS — N6489 Other specified disorders of breast: Secondary | ICD-10-CM

## 2022-03-30 ENCOUNTER — Encounter: Payer: Self-pay | Admitting: Internal Medicine

## 2022-04-04 ENCOUNTER — Encounter: Payer: Self-pay | Admitting: Internal Medicine

## 2022-04-11 ENCOUNTER — Ambulatory Visit
Admission: RE | Admit: 2022-04-11 | Discharge: 2022-04-11 | Disposition: A | Discharge status: Home / Self Care | Payer: BC Managed Care – PPO | Source: Ambulatory Visit | Attending: Internal Medicine | Admitting: Internal Medicine

## 2022-04-11 DIAGNOSIS — N63 Unspecified lump in unspecified breast: Secondary | ICD-10-CM

## 2022-04-11 DIAGNOSIS — R928 Other abnormal and inconclusive findings on diagnostic imaging of breast: Secondary | ICD-10-CM | POA: Diagnosis present

## 2022-04-11 DIAGNOSIS — N6489 Other specified disorders of breast: Secondary | ICD-10-CM | POA: Diagnosis present

## 2022-06-21 ENCOUNTER — Ambulatory Visit: Payer: BC Managed Care – PPO | Admitting: Internal Medicine

## 2022-06-21 NOTE — Progress Notes (Deleted)
Subjective:    Patient ID: Crystal Freeman, female    DOB: December 14, 1975, 46 y.o.   MRN: [id number]  HPI  Patient presents to clinic today for follow-up of chronic conditions.  Migraines: Triggered by hormones, lack of caffeine, hypoglycemia.  These occur rarely.  She takes Imitrex as needed with good relief of symptoms.  She does not follow with neurology.  GAD: Situational.  She is not currently taking any medications for this.  She is not currently seeing a therapist.  She denies depression, SI/HI.  HLD: Her last LDL was 158, triglycerides 129, 01/2022.  She is not taking any cholesterol-lowering medication at this time.  She does not consume a low-fat diet.  Polycythemia: Her last H/H was 16.6/49.1, 01/2022.  She does smoke.  She does not follow with hematology.  She also needs a TB skin test today  Review of Systems     Past Medical History:  Diagnosis Date   Migraine     Current Outpatient Medications  Medication Sig Dispense Refill   acetaminophen (TYLENOL) 500 MG tablet Take by mouth.     norethindrone (AYGESTIN) 5 MG tablet Take 5 mg by mouth daily.     SUMAtriptan (IMITREX) 25 MG tablet TAKE 1 TABLET BY MOUTH ONCE AS NEEDED FOR MIGRAINE FOR UP TO 1 DOSE MAY TAKE SECOND DOSE AFTER 2 HOURS IF NEEDED. 10 tablet 5   No current facility-administered medications for this visit.    Allergies  Allergen Reactions   Statins     Other reaction(s): Muscle Pain   Sulfa Antibiotics Rash    Family History  Problem Relation Age of Onset   COPD Mother    Heart failure Mother    Diabetes Mother    Hypertension Mother    Depression Mother    Hypertension Father    Hyperlipidemia Father    Healthy Sister    Cancer - Colon Neg Hx    Breast cancer Neg Hx    Ovarian cancer Neg Hx     Social History   Socioeconomic History   Marital status: Divorced    Spouse name: Not on file   Number of children: Not on file   Years of education: Not on file   Highest education level:  Not on file  Occupational History   Not on file  Tobacco Use   Smoking status: Every Day    Packs/day: 0.75    Years: 20.00    Total pack years: 15.00    Types: Cigarettes   Smokeless tobacco: Never  Vaping Use   Vaping Use: Never used  Substance and Sexual Activity   Alcohol use: Never   Drug use: Never   Sexual activity: Not Currently  Other Topics Concern   Not on file  Social History Narrative   Not on file   Social Determinants of Health   Financial Resource Strain: Not on file  Food Insecurity: Not on file  Transportation Needs: Not on file  Physical Activity: Not on file  Stress: Not on file  Social Connections: Not on file  Intimate Partner Violence: Not on file     Constitutional: Patient reports intermittent headaches.  Denies fever, malaise, fatigue, or abrupt weight changes.  HEENT: Denies eye pain, eye redness, ear pain, ringing in the ears, wax buildup, runny nose, nasal congestion, bloody nose, or sore throat. Respiratory: Denies difficulty breathing, shortness of breath, cough or sputum production.   Cardiovascular: Denies chest pain, chest tightness, palpitations or swelling in the hands  or feet.  Gastrointestinal: Denies abdominal pain, bloating, constipation, diarrhea or blood in the stool.  GU: Denies urgency, frequency, pain with urination, burning sensation, blood in urine, odor or discharge. Musculoskeletal: Denies decrease in range of motion, difficulty with gait, muscle pain or joint pain and swelling.  Skin: Denies redness, rashes, lesions or ulcercations.  Neurological: Denies dizziness, difficulty with memory, difficulty with speech or problems with balance and coordination.  Psych: Patient has a history of anxiety.  Denies depression, SI/HI.  No other specific complaints in a complete review of systems (except as listed in HPI above).  Objective:   Physical Exam   There were no vitals taken for this visit. Wt Readings from Last 3  Encounters:  03/14/22 201 lb 11.2 oz (91.5 kg)  01/19/22 200 lb (90.7 kg)  12/09/21 201 lb (91.2 kg)    General: Appears their stated age, well developed, well nourished in NAD. Skin: Warm, dry and intact. No rashes, lesions or ulcerations noted. HEENT: Head: normal shape and size; Eyes: sclera white, no icterus, conjunctiva pink, PERRLA and EOMs intact; Ears: Tm's gray and intact, normal light reflex; Nose: mucosa pink and moist, septum midline; Throat/Mouth: Teeth present, mucosa pink and moist, no exudate, lesions or ulcerations noted.  Neck:  Neck supple, trachea midline. No masses, lumps or thyromegaly present.  Cardiovascular: Normal rate and rhythm. S1,S2 noted.  No murmur, rubs or gallops noted. No JVD or BLE edema. No carotid bruits noted. Pulmonary/Chest: Normal effort and positive vesicular breath sounds. No respiratory distress. No wheezes, rales or ronchi noted.  Abdomen: Soft and nontender. Normal bowel sounds. No distention or masses noted. Liver, spleen and kidneys non palpable. Musculoskeletal: Normal range of motion. No signs of joint swelling. No difficulty with gait.  Neurological: Alert and oriented. Cranial nerves II-XII grossly intact. Coordination normal.  Psychiatric: Mood and affect normal. Behavior is normal. Judgment and thought content normal.    BMET    Component Value Date/Time   NA 141 01/19/2022 0823   NA 142 04/26/2012 0123   K 4.1 01/19/2022 0823   K 3.3 (L) 04/26/2012 0123   CL 107 01/19/2022 0823   CL 109 (H) 04/26/2012 0123   CO2 25 01/19/2022 0823   CO2 24 04/26/2012 0123   GLUCOSE 82 01/19/2022 0823   GLUCOSE 100 (H) 04/26/2012 0123   BUN 10 01/19/2022 0823   BUN 7 04/26/2012 0123   CREATININE 0.92 01/19/2022 0823   CALCIUM 9.1 01/19/2022 0823   CALCIUM 9.3 04/26/2012 0123   GFRNONAA >60 04/26/2012 0123   GFRAA >60 04/26/2012 0123    Lipid Panel     Component Value Date/Time   CHOL 208 (H) 01/19/2022 0823   TRIG 129 01/19/2022 0823    HDL 25 (L) 01/19/2022 0823   CHOLHDL 8.3 (H) 01/19/2022 0823   LDLCALC 158 (H) 01/19/2022 0823    CBC    Component Value Date/Time   WBC 11.2 (H) 01/19/2022 0823   RBC 5.43 (H) 01/19/2022 0823   HGB 16.6 (H) 01/19/2022 0823   HGB 10.9 (L) 08/23/2012 2248   HCT 49.1 (H) 01/19/2022 0823   HCT 31.8 (L) 08/25/2012 0715   PLT 188 01/19/2022 0823   PLT 148 (L) 08/23/2012 2248   MCV 90.4 01/19/2022 0823   MCV 88 08/23/2012 2248   MCH 30.6 01/19/2022 0823   MCHC 33.8 01/19/2022 0823   RDW 12.4 01/19/2022 0823   RDW 13.2 08/23/2012 2248   LYMPHSABS 3.0 08/23/2012 2248   MONOABS 0.7  08/23/2012 2248   EOSABS 0.1 08/23/2012 2248   BASOSABS 0.0 08/23/2012 2248    Hgb A1C Lab Results  Component Value Date   HGBA1C 5.3 01/19/2022           Assessment & Plan:     RTC in 7 months for your annual exam Nicki Reaper, NP

## 2022-07-11 ENCOUNTER — Ambulatory Visit (INDEPENDENT_AMBULATORY_CARE_PROVIDER_SITE_OTHER): Payer: BC Managed Care – PPO

## 2022-07-11 DIAGNOSIS — Z111 Encounter for screening for respiratory tuberculosis: Secondary | ICD-10-CM | POA: Diagnosis not present

## 2022-07-13 ENCOUNTER — Ambulatory Visit: Payer: BC Managed Care – PPO

## 2022-07-13 LAB — TB SKIN TEST
Induration: 0 mm
TB Skin Test: NEGATIVE

## 2022-08-17 ENCOUNTER — Encounter: Payer: Self-pay | Admitting: Internal Medicine

## 2022-08-23 ENCOUNTER — Ambulatory Visit (INDEPENDENT_AMBULATORY_CARE_PROVIDER_SITE_OTHER): Payer: BC Managed Care – PPO | Admitting: Internal Medicine

## 2022-08-23 ENCOUNTER — Encounter: Payer: Self-pay | Admitting: Internal Medicine

## 2022-08-23 VITALS — BP 126/84 | HR 92 | Temp 96.9°F | Wt 194.0 lb

## 2022-08-23 DIAGNOSIS — Z6830 Body mass index (BMI) 30.0-30.9, adult: Secondary | ICD-10-CM

## 2022-08-23 DIAGNOSIS — D751 Secondary polycythemia: Secondary | ICD-10-CM | POA: Diagnosis not present

## 2022-08-23 DIAGNOSIS — G43C1 Periodic headache syndromes in child or adult, intractable: Secondary | ICD-10-CM

## 2022-08-23 DIAGNOSIS — E6609 Other obesity due to excess calories: Secondary | ICD-10-CM

## 2022-08-23 DIAGNOSIS — E7800 Pure hypercholesterolemia, unspecified: Secondary | ICD-10-CM | POA: Diagnosis not present

## 2022-08-23 DIAGNOSIS — F411 Generalized anxiety disorder: Secondary | ICD-10-CM

## 2022-08-23 MED ORDER — BUSPIRONE HCL 10 MG PO TABS: 10.0000 mg | ORAL_TABLET | Freq: Every day | ORAL | 1 refills | Status: DC | PRN

## 2022-08-23 NOTE — Assessment & Plan Note (Signed)
CBC today Encourage smoking cessation Advised monthly blood donation

## 2022-08-23 NOTE — Assessment & Plan Note (Signed)
Will start buspirone 10 mg daily as needed Support offered

## 2022-08-23 NOTE — Assessment & Plan Note (Signed)
Encourage diet and exercise for weight loss 

## 2022-08-23 NOTE — Progress Notes (Signed)
Subjective:    Patient ID: Crystal Freeman, female    DOB: November 30, 1975, 46 y.o.   MRN: [id number]  HPI  Patient presents to clinic today for follow-up of chronic conditions.  Migraines: Triggered by hormones, lack of caffeine and not eating consistently.  These occur rarely.  She takes Imitrex as needed with good relief of symptoms.  She does not follow with neurology.  GAD: Persistent.  She is not currently taking any medications for this but does think she needs something to take as needed.  She is not currently seeing a therapist.  She denies depression, SI/HI.  Polycythemia: Her last H/H was 16.6/49.1, 01/2022.  She does smoke.  She does not follow with hematology.  HLD: Her last LDL was 158, triglycerides 129, 01/2022.  She is not taking any cholesterol-lowering medication at this time.  She does not consume low-fat diet.  Review of Systems     Past Medical History:  Diagnosis Date   Migraine     Current Outpatient Medications  Medication Sig Dispense Refill   acetaminophen (TYLENOL) 500 MG tablet Take by mouth.     norethindrone (AYGESTIN) 5 MG tablet Take 5 mg by mouth daily.     SUMAtriptan (IMITREX) 25 MG tablet TAKE 1 TABLET BY MOUTH ONCE AS NEEDED FOR MIGRAINE FOR UP TO 1 DOSE MAY TAKE SECOND DOSE AFTER 2 HOURS IF NEEDED. 10 tablet 5   No current facility-administered medications for this visit.    Allergies  Allergen Reactions   Statins     Other reaction(s): Muscle Pain   Sulfa Antibiotics Rash    Family History  Problem Relation Age of Onset   COPD Mother    Heart failure Mother    Diabetes Mother    Hypertension Mother    Depression Mother    Hypertension Father    Hyperlipidemia Father    Healthy Sister    Cancer - Colon Neg Hx    Breast cancer Neg Hx    Ovarian cancer Neg Hx     Social History   Socioeconomic History   Marital status: Divorced    Spouse name: Not on file   Number of children: Not on file   Years of education: Not on file    Highest education level: Not on file  Occupational History   Not on file  Tobacco Use   Smoking status: Every Day    Packs/day: 0.75    Years: 20.00    Total pack years: 15.00    Types: Cigarettes   Smokeless tobacco: Never  Vaping Use   Vaping Use: Never used  Substance and Sexual Activity   Alcohol use: Never   Drug use: Never   Sexual activity: Not Currently  Other Topics Concern   Not on file  Social History Narrative   Not on file   Social Determinants of Health   Financial Resource Strain: Not on file  Food Insecurity: Not on file  Transportation Needs: Not on file  Physical Activity: Not on file  Stress: Not on file  Social Connections: Not on file  Intimate Partner Violence: Not on file     Constitutional: Patient reports intermittent headaches.  Denies fever, malaise, fatigue, or abrupt weight changes.  HEENT: Denies eye pain, eye redness, ear pain, ringing in the ears, wax buildup, runny nose, nasal congestion, bloody nose, or sore throat. Respiratory: Denies difficulty breathing, shortness of breath, cough or sputum production.   Cardiovascular: Denies chest pain, chest tightness, palpitations or swelling  in the hands or feet.  Gastrointestinal: Denies abdominal pain, bloating, constipation, diarrhea or blood in the stool.  GU: Denies urgency, frequency, pain with urination, burning sensation, blood in urine, odor or discharge. Musculoskeletal: Denies decrease in range of motion, difficulty with gait, muscle pain or joint pain and swelling.  Skin: Denies redness, rashes, lesions or ulcercations.  Neurological: Denies dizziness, difficulty with memory, difficulty with speech or problems with balance and coordination.  Psych: Patient has a history of anxiety.  Denies depression, SI/HI.  No other specific complaints in a complete review of systems (except as listed in HPI above).  Objective:   Physical Exam  BP 126/84 (BP Location: Left Arm, Patient Position:  Sitting, Cuff Size: Normal)   Pulse 92   Temp (!) 96.9 F (36.1 C) (Temporal)   Wt 194 lb (88 kg)   SpO2 100%   BMI 30.38 kg/m   Wt Readings from Last 3 Encounters:  03/14/22 201 lb 11.2 oz (91.5 kg)  01/19/22 200 lb (90.7 kg)  12/09/21 201 lb (91.2 kg)    General: Appears her stated age, obese in NAD. Skin: Warm, dry and intact.  HEENT: Head: normal shape and size; Eyes: sclera white, no icterus, conjunctiva pink, PERRLA and EOMs intact;  Cardiovascular: Normal rate and rhythm. S1,S2 noted.  No murmur, rubs or gallops noted.  Pulmonary/Chest: Normal effort and positive vesicular breath sounds. No respiratory distress. No wheezes, rales or ronchi noted.  Musculoskeletal: No difficulty with gait.  Neurological: Alert and oriented. Coordination normal.  Psychiatric: Mood and affect normal. Behavior is normal. Judgment and thought content normal.    BMET    Component Value Date/Time   NA 141 01/19/2022 0823   NA 142 04/26/2012 0123   K 4.1 01/19/2022 0823   K 3.3 (L) 04/26/2012 0123   CL 107 01/19/2022 0823   CL 109 (H) 04/26/2012 0123   CO2 25 01/19/2022 0823   CO2 24 04/26/2012 0123   GLUCOSE 82 01/19/2022 0823   GLUCOSE 100 (H) 04/26/2012 0123   BUN 10 01/19/2022 0823   BUN 7 04/26/2012 0123   CREATININE 0.92 01/19/2022 0823   CALCIUM 9.1 01/19/2022 0823   CALCIUM 9.3 04/26/2012 0123   GFRNONAA >60 04/26/2012 0123   GFRAA >60 04/26/2012 0123    Lipid Panel     Component Value Date/Time   CHOL 208 (H) 01/19/2022 0823   TRIG 129 01/19/2022 0823   HDL 25 (L) 01/19/2022 0823   CHOLHDL 8.3 (H) 01/19/2022 0823   LDLCALC 158 (H) 01/19/2022 0823    CBC    Component Value Date/Time   WBC 11.2 (H) 01/19/2022 0823   RBC 5.43 (H) 01/19/2022 0823   HGB 16.6 (H) 01/19/2022 0823   HGB 10.9 (L) 08/23/2012 2248   HCT 49.1 (H) 01/19/2022 0823   HCT 31.8 (L) 08/25/2012 0715   PLT 188 01/19/2022 0823   PLT 148 (L) 08/23/2012 2248   MCV 90.4 01/19/2022 0823   MCV 88  08/23/2012 2248   MCH 30.6 01/19/2022 0823   MCHC 33.8 01/19/2022 0823   RDW 12.4 01/19/2022 0823   RDW 13.2 08/23/2012 2248   LYMPHSABS 3.0 08/23/2012 2248   MONOABS 0.7 08/23/2012 2248   EOSABS 0.1 08/23/2012 2248   BASOSABS 0.0 08/23/2012 2248    Hgb A1C Lab Results  Component Value Date   HGBA1C 5.3 01/19/2022            Assessment & Plan:      RTC in 6  months for annual exam Webb Silversmith, NP

## 2022-08-23 NOTE — Assessment & Plan Note (Signed)
Continue Imitrex as needed 

## 2022-08-23 NOTE — Assessment & Plan Note (Signed)
C-Met and lipid profile today Encouraged her to consume a low-fat diet 

## 2022-08-23 NOTE — Patient Instructions (Signed)

## 2022-08-24 ENCOUNTER — Encounter: Payer: Self-pay | Admitting: Internal Medicine

## 2022-08-24 DIAGNOSIS — E7800 Pure hypercholesterolemia, unspecified: Secondary | ICD-10-CM

## 2022-08-24 LAB — LIPID PANEL
Cholesterol: 192 mg/dL (ref <200)
HDL: 27 mg/dL — ABNORMAL LOW (ref >=50)
LDL Cholesterol (Calc): 138 mg/dL (calc) — ABNORMAL HIGH
Non-HDL Cholesterol (Calc): 165 mg/dL (calc) — ABNORMAL HIGH (ref <130)
Total CHOL/HDL Ratio: 7.1 (calc) — ABNORMAL HIGH (ref <5.0)
Triglycerides: 141 mg/dL (ref <150)

## 2022-08-24 LAB — CBC WITH DIFFERENTIAL/PLATELET
Absolute Monocytes: 299 cells/uL (ref 200–950)
Basophils Absolute: 41 cells/uL (ref 0–200)
Basophils Relative: 0.6 %
Eosinophils Absolute: 61 cells/uL (ref 15–500)
Eosinophils Relative: 0.9 %
HCT: 47.5 % — ABNORMAL HIGH (ref 35.0–45.0)
Hemoglobin: 16.3 g/dL — ABNORMAL HIGH (ref 11.7–15.5)
Lymphs Abs: 1788 cells/uL (ref 850–3900)
MCH: 30.6 pg (ref 27.0–33.0)
MCHC: 34.3 g/dL (ref 32.0–36.0)
MCV: 89.3 fL (ref 80.0–100.0)
MPV: 11.9 fL (ref 7.5–12.5)
Monocytes Relative: 4.4 %
Neutro Abs: 4610 cells/uL (ref 1500–7800)
Neutrophils Relative %: 67.8 %
Platelets: 194 10*3/uL (ref 140–400)
RBC: 5.32 10*6/uL — ABNORMAL HIGH (ref 3.80–5.10)
RDW: 12.3 % (ref 11.0–15.0)
Total Lymphocyte: 26.3 %
WBC: 6.8 10*3/uL (ref 3.8–10.8)

## 2022-08-24 LAB — COMPLETE METABOLIC PANEL WITH GFR
AG Ratio: 1.6 (calc) (ref 1.0–2.5)
ALT: 11 U/L (ref 6–29)
AST: 14 U/L (ref 10–30)
Albumin: 4.2 g/dL (ref 3.6–5.1)
Alkaline phosphatase (APISO): 77 U/L (ref 31–125)
BUN: 11 mg/dL (ref 7–25)
CO2: 25 mmol/L (ref 20–32)
Calcium: 8.8 mg/dL (ref 8.6–10.2)
Chloride: 107 mmol/L (ref 98–110)
Creat: 0.92 mg/dL (ref 0.50–0.99)
Globulin: 2.7 g/dL (calc) (ref 1.9–3.7)
Glucose, Bld: 95 mg/dL (ref 65–99)
Potassium: 3.8 mmol/L (ref 3.5–5.3)
Sodium: 141 mmol/L (ref 135–146)
Total Bilirubin: 0.6 mg/dL (ref 0.2–1.2)
Total Protein: 6.9 g/dL (ref 6.1–8.1)
eGFR: 79 mL/min/{1.73_m2} (ref >=60)

## 2022-08-24 MED ORDER — SIMVASTATIN 10 MG PO TABS: 10.0000 mg | ORAL_TABLET | ORAL | 1 refills | Status: DC

## 2022-09-10 ENCOUNTER — Encounter: Payer: Self-pay | Admitting: Emergency Medicine

## 2022-09-10 ENCOUNTER — Ambulatory Visit
Admission: EM | Admit: 2022-09-10 | Discharge: 2022-09-10 | Disposition: A | Discharge status: Home / Self Care | Payer: BC Managed Care – PPO | Attending: Physician Assistant | Admitting: Physician Assistant

## 2022-09-10 DIAGNOSIS — K029 Dental caries, unspecified: Secondary | ICD-10-CM

## 2022-09-10 DIAGNOSIS — K047 Periapical abscess without sinus: Secondary | ICD-10-CM | POA: Diagnosis not present

## 2022-09-10 MED ORDER — AMOXICILLIN-POT CLAVULANATE 875-125 MG PO TABS: 1.0000 | ORAL_TABLET | Freq: Two times a day (BID) | ORAL | 0 refills | Status: AC

## 2022-09-10 NOTE — ED Triage Notes (Signed)
Patient c/o left upper tooth pain that started Friday night.  Patient denies fevers or  chill.

## 2022-09-10 NOTE — ED Provider Notes (Signed)
[facility]    CSN: [id number] Arrival date & time: 09/10/22  1105      History   Chief Complaint Chief Complaint  Patient presents with   Dental Pain    HPI Crystal Freeman is a 47 y.o. female presenting for pain of a tooth on the left upper side that began 2 days ago.  She says she thinks the tooth broke.  She has not had any fevers, facial swelling, chills, trouble swallowing.  She has been treating with OTC meds without relief.  Patient does have a dentist but was unable to see them since it is the weekend.  She plans to follow-up with dentistry.  No other complaints.  HPI  Past Medical History:  Diagnosis Date   Migraine     Patient Active Problem List   Diagnosis Date Noted   Polycythemia 08/23/2022   Pure hypercholesterolemia 08/23/2022   Migraines 12/09/2021   GAD (generalized anxiety disorder) 12/09/2021   Class 1 obesity due to excess calories with body mass index (BMI) of 30.0 to 30.9 in adult 12/09/2021    Past Surgical History:  Procedure Laterality Date   CESAREAN SECTION     10/09/2005 08/24/2012   CHOLECYSTECTOMY      OB History   No obstetric history on file.      Home Medications    Prior to Admission medications   Medication Sig Start Date End Date Taking? Authorizing Provider  amoxicillin-clavulanate (AUGMENTIN) 875-125 MG tablet Take 1 tablet by mouth every 12 (twelve) hours for 7 days. 09/10/22 09/17/22 Yes Shirlee Latch, PA-C  busPIRone (BUSPAR) 10 MG tablet Take 1 tablet (10 mg total) by mouth daily as needed. 08/23/22  Yes Baity, Salvadore Oxford, NP  norethindrone (AYGESTIN) 5 MG tablet Take 5 mg by mouth daily. 07/15/19  Yes [provider]  simvastatin (ZOCOR) 10 MG tablet Take 1 tablet (10 mg total) by mouth 3 (three) times a week. 08/25/22  Yes Lorre Munroe, NP  acetaminophen (TYLENOL) 500 MG tablet Take by mouth.    [provider]  SUMAtriptan (IMITREX) 25 MG tablet TAKE 1 TABLET BY MOUTH ONCE AS NEEDED FOR  MIGRAINE FOR UP TO 1 DOSE MAY TAKE SECOND DOSE AFTER 2 HOURS IF NEEDED. 12/09/21   Lorre Munroe, NP    Family History Family History  Problem Relation Age of Onset   COPD Mother    Heart failure Mother    Diabetes Mother    Hypertension Mother    Depression Mother    Hypertension Father    Hyperlipidemia Father    Healthy Sister    Cancer - Colon Neg Hx    Breast cancer Neg Hx    Ovarian cancer Neg Hx     Social History Social History   Tobacco Use   Smoking status: Every Day    Packs/day: 0.75    Years: 20.00    Total pack years: 15.00    Types: Cigarettes   Smokeless tobacco: Never  Vaping Use   Vaping Use: Never used  Substance Use Topics   Alcohol use: Never   Drug use: Never     Allergies   Statins and Sulfa antibiotics   Review of Systems Review of Systems  Constitutional:  Negative for fatigue and fever.  HENT:  Positive for dental problem. Negative for facial swelling and trouble swallowing.   Neurological:  Negative for weakness.  Hematological:  Negative for adenopathy.     Physical Exam Triage Vital Signs  ED Triage Vitals  Enc Vitals Group     BP      Pulse      Resp      Temp      Temp src      SpO2      Weight      Height      Head Circumference      Peak Flow      Pain Score      Pain Loc      Pain Edu?      Excl. in [place]?    No data found.  Updated Vital Signs BP (!) 144/86 (BP Location: Right Arm)   Pulse 70   Temp 98.4 F (36.9 C) (Oral)   Resp 14   Ht 5\' 7"  (1.702 m)   Wt 194 lb 0.1 oz (88 kg)   LMP 06/12/2022   SpO2 98%   BMI 30.39 kg/m      Physical Exam Vitals and nursing note reviewed.  Constitutional:      General: She is not in acute distress.    Appearance: Normal appearance. She is not ill-appearing or toxic-appearing.  HENT:     Head: Normocephalic and atraumatic.     Nose: Nose normal.     Mouth/Throat:     Mouth: Mucous membranes are moist.     Dentition: Dental caries present.     Pharynx:  Oropharynx is clear.      Comments: Tooth outlined in picture is broken and there is surrounding erythema and swelling.  Tender to palpation. Eyes:     General: No scleral icterus.       Right eye: No discharge.        Left eye: No discharge.     Conjunctiva/sclera: Conjunctivae normal.  Cardiovascular:     Rate and Rhythm: Normal rate and regular rhythm.  Pulmonary:     Effort: Pulmonary effort is normal. No respiratory distress.  Musculoskeletal:     Cervical back: Neck supple.  Skin:    General: Skin is dry.  Neurological:     General: No focal deficit present.     Mental Status: She is alert. Mental status is at baseline.     Motor: No weakness.     Gait: Gait normal.  Psychiatric:        Mood and Affect: Mood normal.        Behavior: Behavior normal.        Thought Content: Thought content normal.      UC Treatments / Results  Labs (all labs ordered are listed, but only abnormal results are displayed) Labs Reviewed - No data to display  EKG   Radiology No results found.  Procedures Procedures (including critical care time)  Medications Ordered in UC Medications - No data to display  Initial Impression / Assessment and Plan / UC Course  I have reviewed the triage vital signs and the nursing notes.  Pertinent labs & imaging results that were available during my care of the patient were reviewed by me and considered in my medical decision making (see chart for details).   47 year old female presents for abdominal pain over to the left upper side x 2 days.  On examination there is a broken tooth as shown in picture.  There is surrounding erythema and swelling in the tooth is tender to palpation.  Suspect dental infection and dental caries.  Will prescribe Augmentin and have her continue with NSAIDs and Tylenol for pain relief  as well as cool compresses.  Follow-up with dentist.  ED precautions given.   Final Clinical Impressions(s) / UC Diagnoses   Final  diagnoses:  Pain due to dental caries  Dental infection     Discharge Instructions      Follow up with dentist at the next available appointment.  In the meantime, we will cover for dental infection with Augmentin.  Take NSAIDs/Tylenol for pain relief. Consider Orajel also. May ice the area. Follow up with dentist in the next few days. In some cases, the tooth may needed to be extracted. Follow up with [place] or ER sooner if the condition worsens before the dental appointment. If they develop a fever, significant soft tissue swelling, or worse pain, go to ER      ED Prescriptions     Medication Sig Dispense Auth. Provider   amoxicillin-clavulanate (AUGMENTIN) 875-125 MG tablet Take 1 tablet by mouth every 12 (twelve) hours for 7 days. 14 tablet Shirlee Latch, PA-C      I have reviewed the PDMP during this encounter.   Shirlee Latch, PA-C 09/10/22 1157

## 2022-09-10 NOTE — Discharge Instructions (Signed)
Follow up with dentist at the next available appointment.  In the meantime, we will cover for dental infection with Augmentin.  Take NSAIDs/Tylenol for pain relief. Consider Orajel also. May ice the area. Follow up with dentist in the next few days. In some cases, the tooth may needed to be extracted. Follow up with [place] or ER sooner if the condition worsens before the dental appointment. If they develop a fever, significant soft tissue swelling, or worse pain, go to ER

## 2022-10-16 ENCOUNTER — Other Ambulatory Visit: Payer: Self-pay | Admitting: Internal Medicine

## 2022-10-17 MED ORDER — NORETHINDRONE ACETATE 5 MG PO TABS: 5.0000 mg | ORAL_TABLET | Freq: Every day | ORAL | 1 refills | Status: DC

## 2022-12-01 ENCOUNTER — Encounter: Payer: Self-pay | Admitting: Internal Medicine

## 2022-12-01 ENCOUNTER — Ambulatory Visit: Payer: BC Managed Care – PPO | Admitting: Internal Medicine

## 2022-12-01 VITALS — BP 142/96 | HR 92 | Temp 96.9°F | Wt 202.0 lb

## 2022-12-01 DIAGNOSIS — I10 Essential (primary) hypertension: Secondary | ICD-10-CM

## 2022-12-01 DIAGNOSIS — Z6831 Body mass index (BMI) 31.0-31.9, adult: Secondary | ICD-10-CM | POA: Diagnosis not present

## 2022-12-01 DIAGNOSIS — M545 Low back pain, unspecified: Secondary | ICD-10-CM

## 2022-12-01 DIAGNOSIS — E6609 Other obesity due to excess calories: Secondary | ICD-10-CM

## 2022-12-01 MED ORDER — OLMESARTAN MEDOXOMIL 20 MG PO TABS: 20.0000 mg | ORAL_TABLET | Freq: Every day | ORAL | 0 refills | Status: DC

## 2022-12-01 MED ORDER — METHOCARBAMOL 500 MG PO TABS: 500.0000 mg | ORAL_TABLET | Freq: Four times a day (QID) | ORAL | 0 refills | Status: DC

## 2022-12-01 NOTE — Assessment & Plan Note (Signed)
Will start olmesartan 20 mg daily Reinforced DASH diet and exercise for weight loss 

## 2022-12-01 NOTE — Progress Notes (Signed)
Subjective:    Patient ID: Crystal Freeman, female    DOB: [birth year], 47 y.o.   MRN: [id number]  HPI  Patient presents to clinic today with concerns of elevated blood pressure.  She has had multiple elevated blood pressure since 12/2021.  She is not currently taking any antihypertensive medications.  Her BP today is 142/96.  She also reports low back pain.  She reports this started 3 days ago while trying to stretch in the bed.  She describes the pain as sharp and throbbing. The pain radiates into her lower extremities but she denies numbness, tingling or weakness. Sitting for too long makes the pain worse and it's usually worse first thing in the morning. It gets better when she moves.  She denies loss of bowel or bladder control. She has tried Ibuprofen, heating pad with some relief of symptoms. .  Review of Systems     Past Medical History:  Diagnosis Date   Migraine     Current Outpatient Medications  Medication Sig Dispense Refill   acetaminophen (TYLENOL) 500 MG tablet Take by mouth.     busPIRone (BUSPAR) 10 MG tablet Take 1 tablet (10 mg total) by mouth daily as needed. 90 tablet 1   norethindrone (AYGESTIN) 5 MG tablet Take 1 tablet (5 mg total) by mouth daily. 90 tablet 1   simvastatin (ZOCOR) 10 MG tablet Take 1 tablet (10 mg total) by mouth 3 (three) times a week. 36 tablet 1   SUMAtriptan (IMITREX) 25 MG tablet TAKE 1 TABLET BY MOUTH ONCE AS NEEDED FOR MIGRAINE FOR UP TO 1 DOSE MAY TAKE SECOND DOSE AFTER 2 HOURS IF NEEDED. 10 tablet 5   No current facility-administered medications for this visit.    Allergies  Allergen Reactions   Statins     Other reaction(s): Muscle Pain   Sulfa Antibiotics Rash    Family History  Problem Relation Age of Onset   COPD Mother    Heart failure Mother    Diabetes Mother    Hypertension Mother    Depression Mother    Hypertension Father    Hyperlipidemia Father    Healthy Sister    Cancer - Colon Neg Hx    Breast cancer Neg Hx     Ovarian cancer Neg Hx     Social History   Socioeconomic History   Marital status: Divorced    Spouse name: Not on file   Number of children: Not on file   Years of education: Not on file   Highest education level: Associate degree: occupational, Hotel manager, or vocational program  Occupational History   Not on file  Tobacco Use   Smoking status: Every Day    Packs/day: 0.75    Years: 20.00    Additional pack years: 0.00    Total pack years: 15.00    Types: Cigarettes   Smokeless tobacco: Never  Vaping Use   Vaping Use: Never used  Substance and Sexual Activity   Alcohol use: Never   Drug use: Never   Sexual activity: Not Currently  Other Topics Concern   Not on file  Social History Narrative   Not on file   Social Determinants of Health   Financial Resource Strain: Medium Risk (11/29/2022)   Overall Financial Resource Strain (CARDIA)    Difficulty of Paying Living Expenses: Somewhat hard  Food Insecurity: No Food Insecurity (11/29/2022)   Hunger Vital Sign    Worried About Running Out of Food in the Last Year: Never  true    Ran Out of Food in the Last Year: Never true  Transportation Needs: No Transportation Needs (11/29/2022)   PRAPARE - Hydrologist (Medical): No    Lack of Transportation (Non-Medical): No  Physical Activity: Insufficiently Active (11/29/2022)   Exercise Vital Sign    Days of Exercise per Week: 1 day    Minutes of Exercise per Session: 40 min  Stress: No Stress Concern Present (11/29/2022)   [place]    Feeling of Stress : Only a little  Social Connections: Moderately Isolated (11/29/2022)   Social Connection and Isolation Panel [NHANES]    Frequency of Communication with Friends and Family: More than three times a week    Frequency of Social Gatherings with Friends and Family: Twice a week    Attends Religious Services: More than 4 times per year     Active Member of [employer] or Organizations: No    Attends Music therapist: Not on file    Marital Status: Divorced  Intimate Partner Violence: Not on file     Constitutional: Patient reports intermittent headaches.  Denies fever, malaise, fatigue, or abrupt weight changes.  HEENT: Denies eye pain, eye redness, ear pain, ringing in the ears, wax buildup, runny nose, nasal congestion, bloody nose, or sore throat. Respiratory: Denies difficulty breathing, shortness of breath, cough or sputum production.   Cardiovascular: Denies chest pain, chest tightness, palpitations or swelling in the hands or feet.  Gastrointestinal: Denies abdominal pain, bloating, constipation, diarrhea or blood in the stool.  GU: Denies urgency, frequency, pain with urination, burning sensation, blood in urine, odor or discharge. Musculoskeletal: Patient reports low back pain.  Denies decrease in range of motion, difficulty with gait, muscle pain or joint swelling.  Skin: Denies redness, rashes, lesions or ulcercations.  Neurological: Denies numbness, ting, weakness or problems with balance and coordination.  Psych: Patient has a history of anxiety.  Denies depression, SI/HI.  No other specific complaints in a complete review of systems (except as listed in HPI above).  Objective:   Physical Exam  BP (!) 142/96 (BP Location: Right Arm, Patient Position: Sitting, Cuff Size: Normal)   Pulse 92   Temp (!) 96.9 F (36.1 C) (Temporal)   Wt 202 lb (91.6 kg)   SpO2 99%   BMI 31.64 kg/m   Wt Readings from Last 3 Encounters:  09/10/22 194 lb 0.1 oz (88 kg)  08/23/22 194 lb (88 kg)  03/14/22 201 lb 11.2 oz (91.5 kg)    General: Appears her stated age, obese in NAD. Skin: Warm, dry and intact. No rashes, lesions or ulcerations noted. HEENT: Head: normal shape and size; Eyes: sclera white, no icterus, conjunctiva pink, PERRLA and EOMs intact;  Cardiovascular: Normal rate and rhythm. S1,S2 noted.  No  murmur, rubs or gallops noted. No JVD or BLE edema.  Pulmonary/Chest: Normal effort and positive vesicular breath sounds. No respiratory distress. No wheezes, rales or ronchi noted.  Musculoskeletal: Normal flexion, extension and rotation of the spine.  Bony tenderness noted over the lumbar spine.  No pain with palpation of the paraspinal muscles.  Strength 5/5 BLE.  No difficulty with gait.  Neurological: Alert and oriented.  Negative SLR bilaterally.    BMET    Component Value Date/Time   NA 141 08/23/2022 0840   NA 142 04/26/2012 0123   K 3.8 08/23/2022 0840   K 3.3 (L) 04/26/2012 0123   CL  107 08/23/2022 0840   CL 109 (H) 04/26/2012 0123   CO2 25 08/23/2022 0840   CO2 24 04/26/2012 0123   GLUCOSE 95 08/23/2022 0840   GLUCOSE 100 (H) 04/26/2012 0123   BUN 11 08/23/2022 0840   BUN 7 04/26/2012 0123   CREATININE 0.92 08/23/2022 0840   CALCIUM 8.8 08/23/2022 0840   CALCIUM 9.3 04/26/2012 0123   GFRNONAA >60 04/26/2012 0123   GFRAA >60 04/26/2012 0123    Lipid Panel     Component Value Date/Time   CHOL 192 08/23/2022 0840   TRIG 141 08/23/2022 0840   HDL 27 (L) 08/23/2022 0840   CHOLHDL 7.1 (H) 08/23/2022 0840   LDLCALC 138 (H) 08/23/2022 0840    CBC    Component Value Date/Time   WBC 6.8 08/23/2022 0840   RBC 5.32 (H) 08/23/2022 0840   HGB 16.3 (H) 08/23/2022 0840   HGB 10.9 (L) 08/23/2012 2248   HCT 47.5 (H) 08/23/2022 0840   HCT 31.8 (L) 08/25/2012 0715   PLT 194 08/23/2022 0840   PLT 148 (L) 08/23/2012 2248   MCV 89.3 08/23/2022 0840   MCV 88 08/23/2012 2248   MCH 30.6 08/23/2022 0840   MCHC 34.3 08/23/2022 0840   RDW 12.3 08/23/2022 0840   RDW 13.2 08/23/2012 2248   LYMPHSABS 1,788 08/23/2022 0840   LYMPHSABS 3.0 08/23/2012 2248   MONOABS 0.7 08/23/2012 2248   EOSABS 61 08/23/2022 0840   EOSABS 0.1 08/23/2012 2248   BASOSABS 41 08/23/2022 0840   BASOSABS 0.0 08/23/2012 2248    Hgb A1C Lab Results  Component Value Date   HGBA1C 5.3 01/19/2022            Assessment & Plan:   Acute Low Back Pain:  Continue ibuprofen as needed Encouraged heat and stretching Rx for Methocarbamol 500 mg every 8 hours as needed-sedation caution given Consider imaging versus PT if symptoms persist or worsen  RTC in 2 weeks, follow-up HTN 3 months for annual exam Webb Silversmith, NP

## 2022-12-01 NOTE — Assessment & Plan Note (Signed)
Encourage diet and exercise for weight loss 

## 2022-12-01 NOTE — Patient Instructions (Signed)

## 2022-12-04 ENCOUNTER — Encounter: Payer: Self-pay | Admitting: Internal Medicine

## 2022-12-19 ENCOUNTER — Ambulatory Visit: Payer: BC Managed Care – PPO | Admitting: Internal Medicine

## 2022-12-19 NOTE — Progress Notes (Deleted)
Subjective:    Patient ID: Crystal Freeman, female    DOB: 19-Oct-1975, 47 y.o.   MRN: [id number]  HPI  Patient presents to clinic today for 2-week follow-up of HTN.  At her last visit, she was started on Olmesartan 20 mg daily.  She has been taking the medication as prescribed.  Her BP today is.  There is no ECG on file.  Review of Systems     Past Medical History:  Diagnosis Date   Migraine     Current Outpatient Medications  Medication Sig Dispense Refill   acetaminophen (TYLENOL) 500 MG tablet Take by mouth.     busPIRone (BUSPAR) 10 MG tablet Take 1 tablet (10 mg total) by mouth daily as needed. 90 tablet 1   methocarbamol (ROBAXIN) 500 MG tablet Take 1 tablet (500 mg total) by mouth 4 (four) times daily. 15 tablet 0   norethindrone (AYGESTIN) 5 MG tablet Take 1 tablet (5 mg total) by mouth daily. 90 tablet 1   olmesartan (BENICAR) 20 MG tablet Take 1 tablet (20 mg total) by mouth daily. 90 tablet 0   simvastatin (ZOCOR) 10 MG tablet Take 1 tablet (10 mg total) by mouth 3 (three) times a week. 36 tablet 1   SUMAtriptan (IMITREX) 25 MG tablet TAKE 1 TABLET BY MOUTH ONCE AS NEEDED FOR MIGRAINE FOR UP TO 1 DOSE MAY TAKE SECOND DOSE AFTER 2 HOURS IF NEEDED. 10 tablet 5   No current facility-administered medications for this visit.    Allergies  Allergen Reactions   Statins     Other reaction(s): Muscle Pain   Sulfa Antibiotics Rash    Family History  Problem Relation Age of Onset   COPD Mother    Heart failure Mother    Diabetes Mother    Hypertension Mother    Depression Mother    Hypertension Father    Hyperlipidemia Father    Healthy Sister    Cancer - Colon Neg Hx    Breast cancer Neg Hx    Ovarian cancer Neg Hx     Social History   Socioeconomic History   Marital status: Divorced    Spouse name: Not on file   Number of children: Not on file   Years of education: Not on file   Highest education level: Associate degree: occupational, Scientist, product/process development, or  vocational program  Occupational History   Not on file  Tobacco Use   Smoking status: Every Day    Packs/day: 0.75    Years: 20.00    Additional pack years: 0.00    Total pack years: 15.00    Types: Cigarettes   Smokeless tobacco: Never  Vaping Use   Vaping Use: Never used  Substance and Sexual Activity   Alcohol use: Never   Drug use: Never   Sexual activity: Not Currently  Other Topics Concern   Not on file  Social History Narrative   Not on file   Social Determinants of Health   Financial Resource Strain: Medium Risk (11/29/2022)   Overall Financial Resource Strain (CARDIA)    Difficulty of Paying Living Expenses: Somewhat hard  Food Insecurity: No Food Insecurity (11/29/2022)   Hunger Vital Sign    Worried About Running Out of Food in the Last Year: Never true    Ran Out of Food in the Last Year: Never true  Transportation Needs: No Transportation Needs (11/29/2022)   PRAPARE - Administrator, Civil Service (Medical): No    Lack of Transportation (  Non-Medical): No  Physical Activity: Insufficiently Active (11/29/2022)   Exercise Vital Sign    Days of Exercise per Week: 1 day    Minutes of Exercise per Session: 40 min  Stress: No Stress Concern Present (11/29/2022)   [employer] of Occupational Health - Occupational Stress Questionnaire    Feeling of Stress : Only a little  Social Connections: Moderately Isolated (11/29/2022)   Social Connection and Isolation Panel [NHANES]    Frequency of Communication with Friends and Family: More than three times a week    Frequency of Social Gatherings with Friends and Family: Twice a week    Attends Religious Services: More than 4 times per year    Active Member of [employer] or Organizations: No    Attends Engineer, structural: Not on file    Marital Status: Divorced  Intimate Partner Violence: Not on file     Constitutional: Pt reports intermittent headaches. Denies fever, malaise, fatigue, or abrupt  weight changes.  HEENT: Denies eye pain, eye redness, ear pain, ringing in the ears, wax buildup, runny nose, nasal congestion, bloody nose, or sore throat. Respiratory: Denies difficulty breathing, shortness of breath, cough or sputum production.   Cardiovascular: Denies chest pain, chest tightness, palpitations or swelling in the hands or feet.  Gastrointestinal: Denies abdominal pain, bloating, constipation, diarrhea or blood in the stool.  GU: Denies urgency, frequency, pain with urination, burning sensation, blood in urine, odor or discharge. Musculoskeletal: Denies decrease in range of motion, difficulty with gait, muscle pain or joint pain and swelling.  Skin: Denies redness, rashes, lesions or ulcercations.  Neurological: Denies dizziness, difficulty with memory, difficulty with speech or problems with balance and coordination.  Psych: Denies anxiety, depression, SI/HI.  No other specific complaints in a complete review of systems (except as listed in HPI above).  Objective:   Physical Exam   There were no vitals taken for this visit. Wt Readings from Last 3 Encounters:  12/01/22 202 lb (91.6 kg)  09/10/22 194 lb 0.1 oz (88 kg)  08/23/22 194 lb (88 kg)    General: Appears their stated age, well developed, well nourished in NAD. Skin: Warm, dry and intact. No rashes, lesions or ulcerations noted. HEENT: Head: normal shape and size; Eyes: sclera white, no icterus, conjunctiva pink, PERRLA and EOMs intact; Ears: Tm's gray and intact, normal light reflex; Nose: mucosa pink and moist, septum midline; Throat/Mouth: Teeth present, mucosa pink and moist, no exudate, lesions or ulcerations noted.  Neck:  Neck supple, trachea midline. No masses, lumps or thyromegaly present.  Cardiovascular: Normal rate and rhythm. S1,S2 noted.  No murmur, rubs or gallops noted. No JVD or BLE edema. No carotid bruits noted. Pulmonary/Chest: Normal effort and positive vesicular breath sounds. No respiratory  distress. No wheezes, rales or ronchi noted.  Abdomen: Soft and nontender. Normal bowel sounds. No distention or masses noted. Liver, spleen and kidneys non palpable. Musculoskeletal: Normal range of motion. No signs of joint swelling. No difficulty with gait.  Neurological: Alert and oriented. Cranial nerves II-XII grossly intact. Coordination normal.  Psychiatric: Mood and affect normal. Behavior is normal. Judgment and thought content normal.     BMET    Component Value Date/Time   NA 141 08/23/2022 0840   NA 142 04/26/2012 0123   K 3.8 08/23/2022 0840   K 3.3 (L) 04/26/2012 0123   CL 107 08/23/2022 0840   CL 109 (H) 04/26/2012 0123   CO2 25 08/23/2022 0840   CO2 24 04/26/2012 0123  GLUCOSE 95 08/23/2022 0840   GLUCOSE 100 (H) 04/26/2012 0123   BUN 11 08/23/2022 0840   BUN 7 04/26/2012 0123   CREATININE 0.92 08/23/2022 0840   CALCIUM 8.8 08/23/2022 0840   CALCIUM 9.3 04/26/2012 0123   GFRNONAA >60 04/26/2012 0123   GFRAA >60 04/26/2012 0123    Lipid Panel     Component Value Date/Time   CHOL 192 08/23/2022 0840   TRIG 141 08/23/2022 0840   HDL 27 (L) 08/23/2022 0840   CHOLHDL 7.1 (H) 08/23/2022 0840   LDLCALC 138 (H) 08/23/2022 0840    CBC    Component Value Date/Time   WBC 6.8 08/23/2022 0840   RBC 5.32 (H) 08/23/2022 0840   HGB 16.3 (H) 08/23/2022 0840   HGB 10.9 (L) 08/23/2012 2248   HCT 47.5 (H) 08/23/2022 0840   HCT 31.8 (L) 08/25/2012 0715   PLT 194 08/23/2022 0840   PLT 148 (L) 08/23/2012 2248   MCV 89.3 08/23/2022 0840   MCV 88 08/23/2012 2248   MCH 30.6 08/23/2022 0840   MCHC 34.3 08/23/2022 0840   RDW 12.3 08/23/2022 0840   RDW 13.2 08/23/2012 2248   LYMPHSABS 1,788 08/23/2022 0840   LYMPHSABS 3.0 08/23/2012 2248   MONOABS 0.7 08/23/2012 2248   EOSABS 61 08/23/2022 0840   EOSABS 0.1 08/23/2012 2248   BASOSABS 41 08/23/2022 0840   BASOSABS 0.0 08/23/2012 2248    Hgb A1C Lab Results  Component Value Date   HGBA1C 5.3 01/19/2022            Assessment & Plan:     RTC in 2 months for your annual exam Nicki Reaper, NP

## 2023-01-26 ENCOUNTER — Ambulatory Visit: Payer: BC Managed Care – PPO | Admitting: Internal Medicine

## 2023-04-03 ENCOUNTER — Telehealth: Payer: BC Managed Care – PPO | Admitting: Physician Assistant

## 2023-04-03 DIAGNOSIS — H9202 Otalgia, left ear: Secondary | ICD-10-CM

## 2023-04-03 MED ORDER — NEOMYCIN-POLYMYXIN-HC 3.5-10000-1 OT SOLN: 4.0000 [drp] | Freq: Four times a day (QID) | OTIC | 0 refills | Status: AC

## 2023-04-03 NOTE — Progress Notes (Signed)
E Visit for Ear Pain - Swimmer's Ear  We are sorry that you are not feeling well. Here is how we plan to help!  Based on what you have shared with me it looks like you have Swimmer's Ear.  Swimmer's ear is a redness or swelling, irritation, or infection of your outer ear canal. These symptoms usually occur within a few days of swimming. Your ear canal is a tube that goes from the opening of the ear to the eardrum.  When water stays in your ear canal, germs can grow.  This is a painful condition that often happens to children and swimmers of all ages.  It is not contagious and oral antibiotics are not required to treat uncomplicated swimmer's ear.  The usual symptoms include:    Itchiness inside the ear  Redness or a sense of swelling in the ear  Pain when the ear is tugged on when pressure is placed on the ear  Pus draining from the infected ear    I have prescribed: Neomycin 0.35%, polymyxin B 10,000 units/mL, and hydrocortisone 0,5% otic solution 4 drops in affected ears four times a day for 7 days  In certain cases, swimmer's ear may progress to a more serious bacterial infection of the middle or inner ear.  If you have a fever 102 and up and significantly worsening symptoms, this could indicate a more serious infection moving to the middle/inner and needs face to face evaluation in an office by a provider.  Your symptoms should improve over the next 3 days and should resolve in about 7 days.  Be sure to complete ALL of your prescription.  HOME CARE: Wash your hands frequently. If you are prescribed an ear drop, do not place the tip of the bottle on your ear or touch it with your fingers. You can take Acetaminophen 650 mg every 4-6 hours as needed for pain.  If pain is severe or moderate, you can apply a heating pad (set on low) or hot water bottle (wrapped in a towel) to outer ear for 20 minutes.  This will also increase drainage. Avoid ear plugs Do not go swimming until the symptoms are  gone Do not use Q-tips After showers, help the water run out by tilting your head to one side.   GET HELP RIGHT AWAY IF: Fever is over 102.2 degrees. You develop progressive ear pain or hearing loss. Ear symptoms persist longer than 3 days after treatment.  MAKE SURE YOU: Understand these instructions. Will watch your condition. Will get help right away if you are not doing well or get worse.  TO PREVENT SWIMMER'S EAR: Use a bathing cap or custom fitted swim molds to keep your ears dry. Towel off after swimming to dry your ears. Tilt your head or pull your earlobes to allow the water to escape your ear canal. If there is still water in your ears, consider using a hairdryer on the lowest setting.  Thank you for choosing an e-visit.  Your e-visit answers were reviewed by a board certified advanced clinical practitioner to complete your personal care plan. Depending upon the condition, your plan could have included both over the counter or prescription medications.  Please review your pharmacy choice. Make sure the pharmacy is open so you can pick up the prescription now. If there is a problem, you may contact your provider through MyChart messaging and have the prescription routed to another pharmacy.  Your safety is important to us. If you have drug   allergies check your prescription carefully.   For the next 24 hours you can use MyChart to ask questions about today's visit, request a non-urgent call back, or ask for a work or school excuse. You will get an email with a survey after your eVisit asking about your experience. We would appreciate your feedback. I hope that your e-visit has been valuable and will aid in your recovery.  

## 2023-04-03 NOTE — Progress Notes (Signed)
I have spent 5 minutes in review of e-visit questionnaire, review and updating patient chart, medical decision making and response to patient.   William Cody Martin, PA-C    

## 2023-05-01 ENCOUNTER — Encounter: Payer: Self-pay | Admitting: Internal Medicine

## 2023-05-01 ENCOUNTER — Ambulatory Visit (INDEPENDENT_AMBULATORY_CARE_PROVIDER_SITE_OTHER): Payer: BC Managed Care – PPO | Admitting: Internal Medicine

## 2023-05-01 VITALS — BP 118/86 | HR 83 | Temp 97.1°F | Ht 67.0 in | Wt 199.6 lb

## 2023-05-01 DIAGNOSIS — Z0001 Encounter for general adult medical examination with abnormal findings: Secondary | ICD-10-CM | POA: Diagnosis not present

## 2023-05-01 DIAGNOSIS — Z6831 Body mass index (BMI) 31.0-31.9, adult: Secondary | ICD-10-CM

## 2023-05-01 DIAGNOSIS — Z1231 Encounter for screening mammogram for malignant neoplasm of breast: Secondary | ICD-10-CM

## 2023-05-01 DIAGNOSIS — E7800 Pure hypercholesterolemia, unspecified: Secondary | ICD-10-CM

## 2023-05-01 DIAGNOSIS — E6609 Other obesity due to excess calories: Secondary | ICD-10-CM

## 2023-05-01 DIAGNOSIS — Z1211 Encounter for screening for malignant neoplasm of colon: Secondary | ICD-10-CM

## 2023-05-01 DIAGNOSIS — R7309 Other abnormal glucose: Secondary | ICD-10-CM

## 2023-05-01 NOTE — Assessment & Plan Note (Signed)
Encourage diet and exercise for weight loss 

## 2023-05-01 NOTE — Patient Instructions (Signed)

## 2023-05-01 NOTE — Progress Notes (Signed)
Subjective:    Patient ID: Crystal Freeman, female    DOB: [birth year], 47 y.o.   MRN: [id number]  HPI  Patient presents to clinic today for her annual exam.  Flu: 06/2022 Tetanus: 06/2017 COVID: X 1 Pap smear: 03/2023, Dr Valentino Saxon Mammogram: 04/2022 Vision screening: as needed Dentist: biannually  Diet: She does eat meat. She consumes fruits and veggies. She does eat some fried foods. She drinks mostly sweet tea, coffee, soda Exercise: None  Review of Systems     Past Medical History:  Diagnosis Date   Migraine     Current Outpatient Medications  Medication Sig Dispense Refill   acetaminophen (TYLENOL) 500 MG tablet Take by mouth.     busPIRone (BUSPAR) 10 MG tablet Take 1 tablet (10 mg total) by mouth daily as needed. 90 tablet 1   methocarbamol (ROBAXIN) 500 MG tablet Take 1 tablet (500 mg total) by mouth 4 (four) times daily. 15 tablet 0   norethindrone (AYGESTIN) 5 MG tablet Take 1 tablet (5 mg total) by mouth daily. 90 tablet 1   olmesartan (BENICAR) 20 MG tablet Take 1 tablet (20 mg total) by mouth daily. 90 tablet 0   simvastatin (ZOCOR) 10 MG tablet Take 1 tablet (10 mg total) by mouth 3 (three) times a week. 36 tablet 1   SUMAtriptan (IMITREX) 25 MG tablet TAKE 1 TABLET BY MOUTH ONCE AS NEEDED FOR MIGRAINE FOR UP TO 1 DOSE MAY TAKE SECOND DOSE AFTER 2 HOURS IF NEEDED. 10 tablet 5   No current facility-administered medications for this visit.    Allergies  Allergen Reactions   Statins     Other reaction(s): Muscle Pain   Sulfa Antibiotics Rash    Family History  Problem Relation Age of Onset   COPD Mother    Heart failure Mother    Diabetes Mother    Hypertension Mother    Depression Mother    Hypertension Father    Hyperlipidemia Father    Healthy Sister    Cancer - Colon Neg Hx    Breast cancer Neg Hx    Ovarian cancer Neg Hx     Social History   Socioeconomic History   Marital status: Divorced    Spouse name: Not on file   Number of children:  Not on file   Years of education: Not on file   Highest education level: Associate degree: occupational, Scientist, product/process development, or vocational program  Occupational History   Not on file  Tobacco Use   Smoking status: Every Day    Current packs/day: 0.75    Average packs/day: 0.8 packs/day for 20.0 years (15.0 ttl pk-yrs)    Types: Cigarettes   Smokeless tobacco: Never  Vaping Use   Vaping status: Never Used  Substance and Sexual Activity   Alcohol use: Never   Drug use: Never   Sexual activity: Not Currently  Other Topics Concern   Not on file  Social History Narrative   Not on file   Social Determinants of Health   Financial Resource Strain: Medium Risk (11/29/2022)   Overall Financial Resource Strain (CARDIA)    Difficulty of Paying Living Expenses: Somewhat hard  Food Insecurity: No Food Insecurity (11/29/2022)   Hunger Vital Sign    Worried About Running Out of Food in the Last Year: Never true    Ran Out of Food in the Last Year: Never true  Transportation Needs: No Transportation Needs (11/29/2022)   PRAPARE - Administrator, Civil Service (Medical):  No    Lack of Transportation (Non-Medical): No  Physical Activity: Insufficiently Active (11/29/2022)   Exercise Vital Sign    Days of Exercise per Week: 1 day    Minutes of Exercise per Session: 40 min  Stress: No Stress Concern Present (11/29/2022)   [employer] of Occupational Health - Occupational Stress Questionnaire    Feeling of Stress : Only a little  Social Connections: Moderately Isolated (11/29/2022)   Social Connection and Isolation Panel [NHANES]    Frequency of Communication with Friends and Family: More than three times a week    Frequency of Social Gatherings with Friends and Family: Twice a week    Attends Religious Services: More than 4 times per year    Active Member of [employer] or Organizations: No    Attends Engineer, structural: Not on file    Marital Status: Divorced  Intimate Partner  Violence: Not on file     Constitutional: Patient reports intermittent headaches.  Denies fever, malaise, fatigue, or abrupt weight changes.  HEENT: Denies eye pain, eye redness, ear pain, ringing in the ears, wax buildup, runny nose, nasal congestion, bloody nose, or sore throat. Respiratory: Denies difficulty breathing, shortness of breath, cough or sputum production.   Cardiovascular: Denies chest pain, chest tightness, palpitations or swelling in the hands or feet.  Gastrointestinal: Denies abdominal pain, bloating, constipation, diarrhea or blood in the stool.  GU: Denies urgency, frequency, pain with urination, burning sensation, blood in urine, odor or discharge. Musculoskeletal: Denies decrease in range of motion, difficulty with gait, muscle pain or joint pain and swelling.  Skin: Denies redness, rashes, lesions or ulcercations.  Neurological: Denies dizziness, difficulty with memory, difficulty with speech or problems with balance and coordination.  Psych: Patient has a history of anxiety.  Denies depression, SI/HI.  No other specific complaints in a complete review of systems (except as listed in HPI above).  Objective:   Physical Exam  BP 118/86 (BP Location: Right Arm, Patient Position: Sitting, Cuff Size: Normal)   Pulse 83   Temp (!) 97.1 F (36.2 C) (Temporal)   Ht 5\' 7"  (1.702 m)   Wt 199 lb 9.6 oz (90.5 kg)   SpO2 97%   BMI 31.26 kg/m   Wt Readings from Last 3 Encounters:  12/01/22 202 lb (91.6 kg)  09/10/22 194 lb 0.1 oz (88 kg)  08/23/22 194 lb (88 kg)    General: Appears her stated age,obese, in NAD. Skin: Warm, dry and intact. HEENT: Head: normal shape and size; Eyes: sclera white, no icterus, conjunctiva pink, PERRLA and EOMs intact; Neck:  Neck supple, trachea midline. No masses, lumps or thyromegaly present.  Cardiovascular: Normal rate and rhythm. S1,S2 noted.  No murmur, rubs or gallops noted. No JVD or BLE edema.  Pulmonary/Chest: Normal effort and  positive vesicular breath sounds. No respiratory distress. No wheezes, rales or ronchi noted.  Abdomen: Normal bowel sounds. Musculoskeletal: Strength 5/5 BUE/BLE.  No difficulty with gait.  Neurological: Alert and oriented. Cranial nerves II-XII grossly intact. Coordination normal.  Psychiatric: Mood and affect normal. Behavior is normal. Judgment and thought content normal.    BMET    Component Value Date/Time   NA 141 08/23/2022 0840   NA 142 04/26/2012 0123   K 3.8 08/23/2022 0840   K 3.3 (L) 04/26/2012 0123   CL 107 08/23/2022 0840   CL 109 (H) 04/26/2012 0123   CO2 25 08/23/2022 0840   CO2 24 04/26/2012 0123   GLUCOSE 95 08/23/2022  0840   GLUCOSE 100 (H) 04/26/2012 0123   BUN 11 08/23/2022 0840   BUN 7 04/26/2012 0123   CREATININE 0.92 08/23/2022 0840   CALCIUM 8.8 08/23/2022 0840   CALCIUM 9.3 04/26/2012 0123   GFRNONAA >60 04/26/2012 0123   GFRAA >60 04/26/2012 0123    Lipid Panel     Component Value Date/Time   CHOL 192 08/23/2022 0840   TRIG 141 08/23/2022 0840   HDL 27 (L) 08/23/2022 0840   CHOLHDL 7.1 (H) 08/23/2022 0840   LDLCALC 138 (H) 08/23/2022 0840    CBC    Component Value Date/Time   WBC 6.8 08/23/2022 0840   RBC 5.32 (H) 08/23/2022 0840   HGB 16.3 (H) 08/23/2022 0840   HGB 10.9 (L) 08/23/2012 2248   HCT 47.5 (H) 08/23/2022 0840   HCT 31.8 (L) 08/25/2012 0715   PLT 194 08/23/2022 0840   PLT 148 (L) 08/23/2012 2248   MCV 89.3 08/23/2022 0840   MCV 88 08/23/2012 2248   MCH 30.6 08/23/2022 0840   MCHC 34.3 08/23/2022 0840   RDW 12.3 08/23/2022 0840   RDW 13.2 08/23/2012 2248   LYMPHSABS 1,788 08/23/2022 0840   LYMPHSABS 3.0 08/23/2012 2248   MONOABS 0.7 08/23/2012 2248   EOSABS 61 08/23/2022 0840   EOSABS 0.1 08/23/2012 2248   BASOSABS 41 08/23/2022 0840   BASOSABS 0.0 08/23/2012 2248    Hgb A1C Lab Results  Component Value Date   HGBA1C 5.3 01/19/2022           Assessment & Plan:   Preventative health  maintenance:  Encouraged her to get a flu shot in the fall Tetanus UTD Encouraged her to get her COVID booster Pap smear UTD, request copy Mammogram ordered-she will call to schedule Referral to GI for screening colonoscopy Encouraged her to consume a balanced diet and exercise regimen Advised her to see an eye doctor and dentist annually We will check CBC, c-Met, lipid, A1c today  RTC in 6 months, follow-up chronic conditions Nicki Reaper, NP

## 2023-05-02 ENCOUNTER — Encounter: Payer: Self-pay | Admitting: Internal Medicine

## 2023-05-02 LAB — CBC
HCT: 45.3 % — ABNORMAL HIGH (ref 35.0–45.0)
Hemoglobin: 15.3 g/dL (ref 11.7–15.5)
MCH: 29.8 pg (ref 27.0–33.0)
MCHC: 33.8 g/dL (ref 32.0–36.0)
MCV: 88.1 fL (ref 80.0–100.0)
MPV: 11.6 fL (ref 7.5–12.5)
Platelets: 197 10*3/uL (ref 140–400)
RBC: 5.14 10*6/uL — ABNORMAL HIGH (ref 3.80–5.10)
RDW: 12.4 % (ref 11.0–15.0)
WBC: 9.4 10*3/uL (ref 3.8–10.8)

## 2023-05-02 LAB — COMPLETE METABOLIC PANEL WITH GFR
AG Ratio: 1.8 (calc) (ref 1.0–2.5)
ALT: 9 U/L (ref 6–29)
AST: 11 U/L (ref 10–35)
Albumin: 4.3 g/dL (ref 3.6–5.1)
Alkaline phosphatase (APISO): 74 U/L (ref 31–125)
BUN: 7 mg/dL (ref 7–25)
CO2: 24 mmol/L (ref 20–32)
Calcium: 9.1 mg/dL (ref 8.6–10.2)
Chloride: 106 mmol/L (ref 98–110)
Creat: 0.9 mg/dL (ref 0.50–0.99)
Globulin: 2.4 g/dL (ref 1.9–3.7)
Glucose, Bld: 92 mg/dL (ref 65–139)
Potassium: 3.8 mmol/L (ref 3.5–5.3)
Sodium: 139 mmol/L (ref 135–146)
Total Bilirubin: 0.6 mg/dL (ref 0.2–1.2)
Total Protein: 6.7 g/dL (ref 6.1–8.1)
eGFR: 80 mL/min/{1.73_m2} (ref >=60)

## 2023-05-02 LAB — LIPID PANEL
Cholesterol: 166 mg/dL (ref <200)
HDL: 25 mg/dL — ABNORMAL LOW (ref >=50)
LDL Cholesterol (Calc): 108 mg/dL — ABNORMAL HIGH
Non-HDL Cholesterol (Calc): 141 mg/dL — ABNORMAL HIGH (ref <130)
Total CHOL/HDL Ratio: 6.6 (calc) — ABNORMAL HIGH (ref <5.0)
Triglycerides: 209 mg/dL — ABNORMAL HIGH (ref <150)

## 2023-05-02 LAB — HEMOGLOBIN A1C
Hgb A1c MFr Bld: 5.6 %{Hb} (ref <5.7)
Mean Plasma Glucose: 114 mg/dL
eAG (mmol/L): 6.3 mmol/L

## 2023-05-14 ENCOUNTER — Encounter: Payer: Self-pay | Admitting: Internal Medicine

## 2023-05-15 ENCOUNTER — Encounter: Payer: Self-pay | Admitting: *Deleted

## 2023-06-05 ENCOUNTER — Other Ambulatory Visit: Payer: Self-pay | Admitting: Internal Medicine

## 2023-06-05 DIAGNOSIS — Z1231 Encounter for screening mammogram for malignant neoplasm of breast: Secondary | ICD-10-CM

## 2023-06-05 DIAGNOSIS — N63 Unspecified lump in unspecified breast: Secondary | ICD-10-CM

## 2023-06-30 ENCOUNTER — Other Ambulatory Visit: Payer: Self-pay | Admitting: Internal Medicine

## 2023-06-30 ENCOUNTER — Encounter: Payer: Self-pay | Admitting: Internal Medicine

## 2023-07-02 MED ORDER — SUMATRIPTAN SUCCINATE 25 MG PO TABS: ORAL_TABLET | ORAL | 5 refills | Status: DC

## 2023-07-02 NOTE — Telephone Encounter (Signed)
Requested Prescriptions  Pending Prescriptions Disp Refills   olmesartan (BENICAR) 20 MG tablet [Pharmacy Med Name: Olmesartan Medoxomil 20 MG Oral Tablet] 90 tablet 0    Sig: Take 1 tablet by mouth once daily     Cardiovascular:  Angiotensin Receptor Blockers Passed - 06/30/2023  6:47 PM      Passed - Cr in normal range and within 180 days    Creat  Date Value Ref Range Status  05/01/2023 0.90 0.50 - 0.99 mg/dL Final         Passed - K in normal range and within 180 days    Potassium  Date Value Ref Range Status  05/01/2023 3.8 3.5 - 5.3 mmol/L Final  04/26/2012 3.3 (L) 3.5 - 5.1 mmol/L Final         Passed - Patient is not pregnant      Passed - Last BP in normal range    BP Readings from Last 1 Encounters:  05/01/23 118/86         Passed - Valid encounter within last 6 months    Recent Outpatient Visits           2 months ago Encounter for general adult medical examination with abnormal findings   [facility] [place], Salvadore Oxford, NP   7 months ago Primary hypertension   [place] [facility] [place], Salvadore Oxford, NP   10 months ago Polycythemia   [facility], Salvadore Oxford, NP   1 year ago Encounter for general adult medical examination with abnormal findings   [place] [place], [place], NP   1 year ago Elevated blood pressure reading without diagnosis of hypertension   [place] [facility] [place], Salvadore Oxford, NP       Future Appointments             In 4 months Sampson Si, Salvadore Oxford, NP [facility]             simvastatin (ZOCOR) 10 MG tablet [Pharmacy Med Name: Simvastatin 10 MG Oral Tablet] 36 tablet 0    Sig: TAKE 1 TABLET BY MOUTH THREE TIMES A WEEK     Cardiovascular:  Antilipid - Statins Failed - 06/30/2023  6:47 PM      Failed - Lipid Panel in normal range within the last 12 months    Cholesterol  Date Value  Ref Range Status  05/01/2023 166 <200 mg/dL Final   LDL Cholesterol (Calc)  Date Value Ref Range Status  05/01/2023 108 (H) mg/dL (calc) Final    Comment:    Reference range: <100 . Desirable range <100 mg/dL for primary prevention;   <70 mg/dL for patients with CHD or diabetic patients  with > or = 2 CHD risk factors. Marland Kitchen LDL-C is now calculated using the Martin-Hopkins  calculation, which is a validated novel method providing  better accuracy than the Friedewald equation in the  estimation of LDL-C.  Horald Pollen et al. Lenox Ahr. 1610;960(45): 2061-2068  (http://education.[facility].com/faq/FAQ164)    HDL  Date Value Ref Range Status  05/01/2023 25 (L) > OR = 50 mg/dL Final   Triglycerides  Date Value Ref Range Status  05/01/2023 209 (H) <150 mg/dL Final    Comment:    . If a non-fasting specimen was collected, consider repeat triglyceride testing on a fasting specimen if clinically indicated.  Perry Mount et al. J. of Clin.  Lipidol. 2015;9:129-169. Marland Kitchen          Passed - Patient is not pregnant      Passed - Valid encounter within last 12 months    Recent Outpatient Visits           2 months ago Encounter for general adult medical examination with abnormal findings   [facility] [place], Salvadore Oxford, NP   7 months ago Primary hypertension   [place] [facility] [place], Salvadore Oxford, NP   10 months ago Polycythemia   [place] [facility] [place], Salvadore Oxford, NP   1 year ago Encounter for general adult medical examination with abnormal findings   [place] [facility] [place], Salvadore Oxford, NP   1 year ago Elevated blood pressure reading without diagnosis of hypertension   [place] [facility] [place], Salvadore Oxford, NP       Future Appointments             In 4 months Baity, Salvadore Oxford, NP [facility]

## 2023-08-09 ENCOUNTER — Telehealth: Payer: BC Managed Care – PPO | Admitting: Physician Assistant

## 2023-08-09 DIAGNOSIS — M549 Dorsalgia, unspecified: Secondary | ICD-10-CM

## 2023-08-09 MED ORDER — CYCLOBENZAPRINE HCL 10 MG PO TABS
5.0000 mg | ORAL_TABLET | Freq: Three times a day (TID) | ORAL | 0 refills | Status: DC | PRN
Start: 2023-08-09 — End: 2023-08-19

## 2023-08-09 MED ORDER — NAPROXEN 500 MG PO TABS
500.0000 mg | ORAL_TABLET | Freq: Two times a day (BID) | ORAL | 0 refills | Status: DC
Start: 2023-08-09 — End: 2023-08-19

## 2023-08-09 NOTE — Progress Notes (Signed)

## 2023-08-19 ENCOUNTER — Ambulatory Visit
Admission: EM | Admit: 2023-08-19 | Discharge: 2023-08-19 | Disposition: A | Discharge status: Home / Self Care | Payer: BC Managed Care – PPO | Attending: Emergency Medicine | Admitting: Emergency Medicine

## 2023-08-19 DIAGNOSIS — M546 Pain in thoracic spine: Secondary | ICD-10-CM

## 2023-08-19 MED ORDER — DEXAMETHASONE SODIUM PHOSPHATE 10 MG/ML IJ SOLN
10.0000 mg | Freq: Once | INTRAMUSCULAR | Status: AC
  Administered 2023-08-19: 10 mg via INTRAMUSCULAR

## 2023-08-19 MED ORDER — PREDNISONE 10 MG (21) PO TBPK: ORAL_TABLET | ORAL | 0 refills | Status: DC

## 2023-08-19 MED ORDER — BACLOFEN 10 MG PO TABS: 10.0000 mg | ORAL_TABLET | Freq: Three times a day (TID) | ORAL | 0 refills | Status: DC

## 2023-08-19 NOTE — Discharge Instructions (Addendum)
72 at breakfast tomorrow morning begin taking the prednisone according to the package instructions.  You will take this each morning at breakfast time for the next 6 days.  Take the baclofen, 10 mg every 8 hours, on a schedule for the next 48 hours and then as needed.  Apply moist heat to your back for 30 minutes at a time 2-3 times a day to improve blood flow to the area and help remove the lactic acid causing the spasm.  Follow the back exercises given at discharge.  Return for reevaluation for any new or worsening symptoms.

## 2023-08-19 NOTE — ED Triage Notes (Signed)
Pain around the left shoulder blade area for 2 weeks. Did evisit and was sent in flexeril and naproxen that's not helping. Sharp pain. No injury. Woke up and shoulder was hurting

## 2023-08-19 NOTE — ED Provider Notes (Signed)
[facility]    CSN: [id number] Arrival date & time: 08/19/23  1243      History   Chief Complaint Chief Complaint  Patient presents with   Shoulder Pain    HPI Crystal Freeman is a 47 y.o. female.   HPI  47 year old female with a past medical history significant for generalized anxiety disorder, primary hypertension, pure hypercholesterolemia, and migraines presents for evaluation of pain in her left shoulder blade area that started 2 weeks ago.  She reports it was there when she woke up 1 morning and she denies any injury or heavy lifting.  She describes the pain as sharp and is present at rest with no real provoking or alleviating symptoms.  She had an ED visit was prescribed Flexeril and Naprosyn but this has not been helping her symptoms.  Past Medical History:  Diagnosis Date   Migraine     Patient Active Problem List   Diagnosis Date Noted   Primary hypertension 12/01/2022   Polycythemia 08/23/2022   Pure hypercholesterolemia 08/23/2022   Migraines 12/09/2021   GAD (generalized anxiety disorder) 12/09/2021   Class 1 obesity due to excess calories with body mass index (BMI) of 31.0 to 31.9 in adult 12/09/2021    Past Surgical History:  Procedure Laterality Date   CESAREAN SECTION     10/09/2005 08/24/2012   CHOLECYSTECTOMY      OB History   No obstetric history on file.      Home Medications    Prior to Admission medications   Medication Sig Start Date End Date Taking? Authorizing Provider  acetaminophen (TYLENOL) 500 MG tablet Take by mouth.   Yes [provider]  baclofen (LIORESAL) 10 MG tablet Take 1 tablet (10 mg total) by mouth 3 (three) times daily. 08/19/23  Yes Becky Augusta, NP  busPIRone (BUSPAR) 10 MG tablet Take 1 tablet (10 mg total) by mouth daily as needed. 08/23/22  Yes Baity, Salvadore Oxford, NP  norethindrone (AYGESTIN) 5 MG tablet Take 1 tablet (5 mg total) by mouth daily. 10/17/22  Yes Lorre Munroe, NP  olmesartan  (BENICAR) 20 MG tablet Take 1 tablet by mouth once daily 07/02/23  Yes Baity, Salvadore Oxford, NP  predniSONE (STERAPRED UNI-PAK 21 TAB) 10 MG (21) TBPK tablet Take 6 tablets on day 1, 5 tablets day 2, 4 tablets day 3, 3 tablets day 4, 2 tablets day 5, 1 tablet day 6 08/19/23  Yes Becky Augusta, NP  simvastatin (ZOCOR) 10 MG tablet TAKE 1 TABLET BY MOUTH THREE TIMES A WEEK 07/02/23  Yes Baity, Salvadore Oxford, NP  SUMAtriptan (IMITREX) 25 MG tablet TAKE 1 TABLET BY MOUTH ONCE AS NEEDED FOR MIGRAINE FOR UP TO 1 DOSE MAY TAKE SECOND DOSE AFTER 2 HOURS IF NEEDED. 07/02/23  Yes Baity, Salvadore Oxford, NP    Family History Family History  Problem Relation Age of Onset   COPD Mother    Heart failure Mother    Diabetes Mother    Hypertension Mother    Depression Mother    Hypertension Father    Hyperlipidemia Father    Healthy Sister    Cancer - Colon Neg Hx    Breast cancer Neg Hx    Ovarian cancer Neg Hx     Social History Social History   Tobacco Use   Smoking status: Every Day    Current packs/day: 0.75    Average packs/day: 0.8 packs/day for 20.0 years (15.0 ttl pk-yrs)    Types: Cigarettes  Smokeless tobacco: Never  Vaping Use   Vaping status: Never Used  Substance Use Topics   Alcohol use: Never   Drug use: Never     Allergies   Statins and Sulfa antibiotics   Review of Systems Review of Systems  Musculoskeletal:  Positive for myalgias.       Left shoulder blade pain.     Physical Exam Triage Vital Signs ED Triage Vitals  Encounter Vitals Group     BP 08/19/23 1310 (!) 141/105     Systolic BP Percentile --      Diastolic BP Percentile --      Pulse Rate 08/19/23 1310 83     Resp 08/19/23 1310 17     Temp 08/19/23 1310 98.9 F (37.2 C)     Temp Source 08/19/23 1310 Oral     SpO2 08/19/23 1310 99 %     Weight --      Height --      Head Circumference --      Peak Flow --      Pain Score 08/19/23 1309 8     Pain Loc --      Pain Education --      Exclude from Growth  Chart --    No data found.  Updated Vital Signs BP (!) 141/105 (BP Location: Left Arm)   Pulse 83   Temp 98.9 F (37.2 C) (Oral)   Resp 17   SpO2 99%   Visual Acuity Right Eye Distance:   Left Eye Distance:   Bilateral Distance:    Right Eye Near:   Left Eye Near:    Bilateral Near:     Physical Exam Vitals and nursing note reviewed.  Constitutional:      Appearance: Normal appearance. She is not ill-appearing.  HENT:     Head: Normocephalic and atraumatic.  Musculoskeletal:        General: Tenderness present. No swelling or signs of injury.  Skin:    General: Skin is warm and dry.     Capillary Refill: Capillary refill takes less than 2 seconds.  Neurological:     General: No focal deficit present.     Mental Status: She is alert and oriented to person, place, and time.      UC Treatments / Results  Labs (all labs ordered are listed, but only abnormal results are displayed) Labs Reviewed - No data to display  EKG   Radiology No results found.  Procedures Procedures (including critical care time)  Medications Ordered in UC Medications  dexamethasone (DECADRON) injection 10 mg (has no administration in time range)    Initial Impression / Assessment and Plan / UC Course  I have reviewed the triage vital signs and the nursing notes.  Pertinent labs & imaging results that were available during my care of the patient were reviewed by me and considered in my medical decision making (see chart for details).   Patient is a pleasant, nontoxic-appearing 47 year old female presenting for evaluation of pain in her left lateral thoracic region that started 2 weeks ago without any provoking event.  She is currently taking Naprosyn and Flexeril without any improvement of her symptoms.  On exam she has no midline spinous process tenderness or step-off in her thoracic spine and she has no tenderness with palpation of her scapular body.  She does have some tenderness with  palpation lateral to the left scapula over the area of the latissimus dorsi.  There is muscle tension  but no overt spasm noted there.  Given that patient has been using Naprosyn and Flexeril for the last 2 weeks on the improvement of symptoms I will switch her to prednisone as I feel this is musculoskeletal inflammation.  We will give 10 mg of IM Decadron here in clinic and she can start a prednisone taper tomorrow morning.  Additionally, I will switch her muscle laxer to baclofen from Flexeril and she can take it 3 times a day to help with muscle tension.  Moist heat application to improve blood flow to the muscle group along with shoulder range of motion exercises to help stretch out those muscles and see if it improves her symptoms.  If her symptoms do not improve I will have her follow-up with orthopedics.   Final Clinical Impressions(s) / UC Diagnoses   Final diagnoses:  Acute left-sided thoracic back pain     Discharge Instructions      72 at breakfast tomorrow morning begin taking the prednisone according to the package instructions.  You will take this each morning at breakfast time for the next 6 days.  Take the baclofen, 10 mg every 8 hours, on a schedule for the next 48 hours and then as needed.  Apply moist heat to your back for 30 minutes at a time 2-3 times a day to improve blood flow to the area and help remove the lactic acid causing the spasm.  Follow the back exercises given at discharge.  Return for reevaluation for any new or worsening symptoms.      ED Prescriptions     Medication Sig Dispense Auth. Provider   predniSONE (STERAPRED UNI-PAK 21 TAB) 10 MG (21) TBPK tablet Take 6 tablets on day 1, 5 tablets day 2, 4 tablets day 3, 3 tablets day 4, 2 tablets day 5, 1 tablet day 6 21 tablet Becky Augusta, NP   baclofen (LIORESAL) 10 MG tablet Take 1 tablet (10 mg total) by mouth 3 (three) times daily. 30 each Becky Augusta, NP      PDMP not reviewed this encounter.    Becky Augusta, NP 08/19/23 1327

## 2023-08-27 ENCOUNTER — Ambulatory Visit: Payer: BC Managed Care – PPO | Admitting: Internal Medicine

## 2023-08-27 VITALS — BP 132/88 | Ht 67.0 in | Wt 200.0 lb

## 2023-08-27 DIAGNOSIS — S00412A Abrasion of left ear, initial encounter: Secondary | ICD-10-CM

## 2023-08-27 DIAGNOSIS — H66002 Acute suppurative otitis media without spontaneous rupture of ear drum, left ear: Secondary | ICD-10-CM

## 2023-08-27 MED ORDER — AMOXICILLIN 875 MG PO TABS: 875.0000 mg | ORAL_TABLET | Freq: Two times a day (BID) | ORAL | 0 refills | Status: AC

## 2023-08-27 NOTE — Progress Notes (Signed)
Subjective:    Patient ID: Crystal Freeman, female    DOB: [birth year], 47 y.o.   MRN: [id number]  HPI  Discussed the use of AI scribe software for clinical note transcription with the patient, who gave verbal consent to proceed.   The patient, with a history of recurrent ear infections since childhood, presents with left ear pain of four days duration. The pain is described as sharp and stabbing, occasionally intensifying to a severe level, which has been disruptive to her sleep. She denies any associated symptoms such as headache, nasal congestion, sore throat, or cough, and reports no drainage or hearing loss in the affected ear.  Over-the-counter remedies such as Tylenol, ibuprofen, and Sweet Oil have been attempted without significant relief. The patient also reports a habit of using Q-tips for ear cleaning, which has been ongoing for several weeks. She denies noticing any blood associated with this practice.  The patient's right ear is reportedly unaffected. She has a history of recurrent ear infections since childhood, and the current pain is perceived as being deep within the ear, rather than in the ear canal.       Review of Systems   Past Medical History:  Diagnosis Date   Migraine     Current Outpatient Medications  Medication Sig Dispense Refill   acetaminophen (TYLENOL) 500 MG tablet Take by mouth.     baclofen (LIORESAL) 10 MG tablet Take 1 tablet (10 mg total) by mouth 3 (three) times daily. 30 each 0   busPIRone (BUSPAR) 10 MG tablet Take 1 tablet (10 mg total) by mouth daily as needed. 90 tablet 1   norethindrone (AYGESTIN) 5 MG tablet Take 1 tablet (5 mg total) by mouth daily. 90 tablet 1   olmesartan (BENICAR) 20 MG tablet Take 1 tablet by mouth once daily 90 tablet 0   predniSONE (STERAPRED UNI-PAK 21 TAB) 10 MG (21) TBPK tablet Take 6 tablets on day 1, 5 tablets day 2, 4 tablets day 3, 3 tablets day 4, 2 tablets day 5, 1 tablet day 6 21 tablet 0   simvastatin  (ZOCOR) 10 MG tablet TAKE 1 TABLET BY MOUTH THREE TIMES A WEEK 36 tablet 0   SUMAtriptan (IMITREX) 25 MG tablet TAKE 1 TABLET BY MOUTH ONCE AS NEEDED FOR MIGRAINE FOR UP TO 1 DOSE MAY TAKE SECOND DOSE AFTER 2 HOURS IF NEEDED. 10 tablet 5   No current facility-administered medications for this visit.    Allergies  Allergen Reactions   Statins     Other reaction(s): Muscle Pain   Sulfa Antibiotics Rash    Family History  Problem Relation Age of Onset   COPD Mother    Heart failure Mother    Diabetes Mother    Hypertension Mother    Depression Mother    Hypertension Father    Hyperlipidemia Father    Healthy Sister    Cancer - Colon Neg Hx    Breast cancer Neg Hx    Ovarian cancer Neg Hx     Social History   Socioeconomic History   Marital status: Divorced    Spouse name: Not on file   Number of children: Not on file   Years of education: Not on file   Highest education level: Associate degree: occupational, Scientist, product/process development, or vocational program  Occupational History   Not on file  Tobacco Use   Smoking status: Every Day    Current packs/day: 0.75    Average packs/day: 0.8 packs/day for 20.0 years (15.0  ttl pk-yrs)    Types: Cigarettes   Smokeless tobacco: Never  Vaping Use   Vaping status: Never Used  Substance and Sexual Activity   Alcohol use: Never   Drug use: Never   Sexual activity: Not Currently  Other Topics Concern   Not on file  Social History Narrative   Not on file   Social Drivers of Health   Financial Resource Strain: Medium Risk (11/29/2022)   Overall Financial Resource Strain (CARDIA)    Difficulty of Paying Living Expenses: Somewhat hard  Food Insecurity: No Food Insecurity (11/29/2022)   Hunger Vital Sign    Worried About Running Out of Food in the Last Year: Never true    Ran Out of Food in the Last Year: Never true  Transportation Needs: No Transportation Needs (11/29/2022)   PRAPARE - Administrator, Civil Service (Medical): No     Lack of Transportation (Non-Medical): No  Physical Activity: Insufficiently Active (11/29/2022)   Exercise Vital Sign    Days of Exercise per Week: 1 day    Minutes of Exercise per Session: 40 min  Stress: No Stress Concern Present (11/29/2022)   [employer] of Occupational Health - Occupational Stress Questionnaire    Feeling of Stress : Only a little  Social Connections: Moderately Isolated (11/29/2022)   Social Connection and Isolation Panel [NHANES]    Frequency of Communication with Friends and Family: More than three times a week    Frequency of Social Gatherings with Friends and Family: Twice a week    Attends Religious Services: More than 4 times per year    Active Member of [employer] or Organizations: No    Attends Engineer, structural: Not on file    Marital Status: Divorced  Intimate Partner Violence: Not on file     Constitutional: Denies fever, malaise, fatigue, headache or abrupt weight changes.  HEENT: Patient reports left ear pain.  Denies eye pain, eye redness, ringing in the ears, wax buildup, runny nose, nasal congestion, bloody nose, or sore throat. Respiratory: Denies difficulty breathing, shortness of breath, cough or sputum production.   Cardiovascular: Denies chest pain, chest tightness, palpitations or swelling in the hands or feet.   No other specific complaints in a complete review of systems (except as listed in HPI above).      Objective:   Physical Exam BP 132/88 (BP Location: Left Arm, Patient Position: Sitting, Cuff Size: Large)   Ht 5\' 7"  (1.702 m)   Wt 200 lb (90.7 kg)   BMI 31.32 kg/m   Wt Readings from Last 3 Encounters:  05/01/23 199 lb 9.6 oz (90.5 kg)  12/01/22 202 lb (91.6 kg)  09/10/22 194 lb 0.1 oz (88 kg)    General: Appears her stated age, obese, in NAD. Skin: Warm, dry and intact.  HEENT: Head: normal shape and size; Eyes: sclera white, no icterus, conjunctiva pink, PERRLA and EOMs intact; Left Ear: Tm's cloudy and  gray, distorted light reflex, excoriation with scabbing noted of the ear canal;  Neck:  No adenopathy noted. Cardiovascular: Normal rate and rhythm.  Pulmonary/Chest: Normal effort and positive vesicular breath sounds. No respiratory distress. No wheezes, rales or ronchi noted.  Neurological: Alert and oriented.   BMET    Component Value Date/Time   NA 139 05/01/2023 1552   NA 142 04/26/2012 0123   K 3.8 05/01/2023 1552   K 3.3 (L) 04/26/2012 0123   CL 106 05/01/2023 1552   CL 109 (H) 04/26/2012 0123  CO2 24 05/01/2023 1552   CO2 24 04/26/2012 0123   GLUCOSE 92 05/01/2023 1552   GLUCOSE 100 (H) 04/26/2012 0123   BUN 7 05/01/2023 1552   BUN 7 04/26/2012 0123   CREATININE 0.90 05/01/2023 1552   CALCIUM 9.1 05/01/2023 1552   CALCIUM 9.3 04/26/2012 0123   GFRNONAA >60 04/26/2012 0123   GFRAA >60 04/26/2012 0123    Lipid Panel     Component Value Date/Time   CHOL 166 05/01/2023 1552   TRIG 209 (H) 05/01/2023 1552   HDL 25 (L) 05/01/2023 1552   CHOLHDL 6.6 (H) 05/01/2023 1552   LDLCALC 108 (H) 05/01/2023 1552    CBC    Component Value Date/Time   WBC 9.4 05/01/2023 1552   RBC 5.14 (H) 05/01/2023 1552   HGB 15.3 05/01/2023 1552   HGB 10.9 (L) 08/23/2012 2248   HCT 45.3 (H) 05/01/2023 1552   HCT 31.8 (L) 08/25/2012 0715   PLT 197 05/01/2023 1552   PLT 148 (L) 08/23/2012 2248   MCV 88.1 05/01/2023 1552   MCV 88 08/23/2012 2248   MCH 29.8 05/01/2023 1552   MCHC 33.8 05/01/2023 1552   RDW 12.4 05/01/2023 1552   RDW 13.2 08/23/2012 2248   LYMPHSABS 1,788 08/23/2022 0840   LYMPHSABS 3.0 08/23/2012 2248   MONOABS 0.7 08/23/2012 2248   EOSABS 61 08/23/2022 0840   EOSABS 0.1 08/23/2012 2248   BASOSABS 41 08/23/2022 0840   BASOSABS 0.0 08/23/2012 2248    Hgb A1C Lab Results  Component Value Date   HGBA1C 5.6 05/01/2023            Assessment & Plan:   Assessment and Plan    Left Ear Pain Sharp, stabbing pain for 4 days. No drainage or hearing loss.  History of ear infections. Examination revealed bloody ear canal likely due to scratching with Q-tip and scarred eardrum. -Discontinue use of Q-tips in ear. -Start Amoxicillin 875mg  twice daily for 10 days. -Continue Tylenol and Ibuprofen as needed for pain. -Start Flonase twice daily while on antibiotic. -Follow up if symptoms do not improve.       RTC in 2 months for follow-up of chronic conditions Nicki Reaper, NP

## 2023-08-27 NOTE — Patient Instructions (Signed)

## 2023-09-10 ENCOUNTER — Encounter: Payer: Self-pay | Admitting: Internal Medicine

## 2023-09-14 ENCOUNTER — Telehealth (INDEPENDENT_AMBULATORY_CARE_PROVIDER_SITE_OTHER): Payer: 59 | Admitting: Internal Medicine

## 2023-09-14 ENCOUNTER — Telehealth: Payer: 59 | Admitting: Internal Medicine

## 2023-09-14 ENCOUNTER — Encounter: Payer: Self-pay | Admitting: Internal Medicine

## 2023-09-14 DIAGNOSIS — G4700 Insomnia, unspecified: Secondary | ICD-10-CM

## 2023-09-14 DIAGNOSIS — F5104 Psychophysiologic insomnia: Secondary | ICD-10-CM | POA: Insufficient documentation

## 2023-09-14 MED ORDER — TRAZODONE HCL 50 MG PO TABS: 25.0000 mg | ORAL_TABLET | Freq: Every evening | ORAL | 0 refills | Status: AC | PRN

## 2023-09-14 NOTE — Patient Instructions (Signed)
 Insomnia Insomnia is a sleep disorder that makes it difficult to fall asleep or stay asleep. Insomnia can cause fatigue, low energy, difficulty concentrating, mood swings, and poor performance at work or school. There are three different ways to classify insomnia: Difficulty falling asleep. Difficulty staying asleep. Waking up too early in the morning. Any type of insomnia can be long-term (chronic) or short-term (acute). Both are common. Short-term insomnia usually lasts for 3 months or less. Chronic insomnia occurs at least three times a week for longer than 3 months. What are the causes? Insomnia may be caused by another condition, situation, or substance, such as: Having certain mental health conditions, such as anxiety and depression. Using caffeine, alcohol , tobacco, or drugs. Having gastrointestinal conditions, such as gastroesophageal reflux disease (GERD). Having certain medical conditions. These include: Asthma. Alzheimer's disease. Stroke. Chronic pain. An overactive thyroid  gland (hyperthyroidism). Other sleep disorders, such as restless legs syndrome and sleep apnea. Menopause. Sometimes, the cause of insomnia may not be known. What increases the risk? Risk factors for insomnia include: Gender. Females are affected more often than males. Age. Insomnia is more common as people get older. Stress and certain medical and mental health conditions. Lack of exercise. Having an irregular work schedule. This may include working night shifts and traveling between different time zones. What are the signs or symptoms? If you have insomnia, the main symptom is having trouble falling asleep or having trouble staying asleep. This may lead to other symptoms, such as: Feeling tired or having low energy. Feeling nervous about going to sleep. Not feeling rested in the morning. Having trouble concentrating. Feeling irritable, anxious, or depressed. How is this diagnosed? This condition  may be diagnosed based on: Your symptoms and medical history. Your health care provider may ask about: Your sleep habits. Any medical conditions you have. Your mental health. A physical exam. How is this treated? Treatment for insomnia depends on the cause. Treatment may focus on treating an underlying condition that is causing the insomnia. Treatment may also include: Medicines to help you sleep. Counseling or therapy. Lifestyle adjustments to help you sleep better. Follow these instructions at home: Eating and drinking  Limit or avoid alcohol , caffeinated beverages, and products that contain nicotine and tobacco, especially close to bedtime. These can disrupt your sleep. Do not eat a large meal or eat spicy foods right before bedtime. This can lead to digestive discomfort that can make it hard for you to sleep. Sleep habits  Keep a sleep diary to help you and your health care provider figure out what could be causing your insomnia. Write down: When you sleep. When you wake up during the night. How well you sleep and how rested you feel the next day. Any side effects of medicines you are taking. What you eat and drink. Make your bedroom a dark, comfortable place where it is easy to fall asleep. Put up shades or blackout curtains to block light from outside. Use a white noise machine to block noise. Keep the temperature cool. Limit screen use before bedtime. This includes: Not watching TV. Not using your smartphone, tablet, or computer. Stick to a routine that includes going to bed and waking up at the same times every day and night. This can help you fall asleep faster. Consider making a quiet activity, such as reading, part of your nighttime routine. Try to avoid taking naps during the day so that you sleep better at night. Get out of bed if you are still awake after  15 minutes of trying to sleep. Keep the lights down, but try reading or doing a quiet activity. When you feel  sleepy, go back to bed. General instructions Take over-the-counter and prescription medicines only as told by your health care provider. Exercise regularly as told by your health care provider. However, avoid exercising in the hours right before bedtime. Use relaxation techniques to manage stress. Ask your health care provider to suggest some techniques that may work well for you. These may include: Breathing exercises. Routines to release muscle tension. Visualizing peaceful scenes. Make sure that you drive carefully. Do not drive if you feel very sleepy. Keep all follow-up visits. This is important. Contact a health care provider if: You are tired throughout the day. You have trouble in your daily routine due to sleepiness. You continue to have sleep problems, or your sleep problems get worse. Get help right away if: You have thoughts about hurting yourself or someone else. Get help right away if you feel like you may hurt yourself or others, or have thoughts about taking your own life. Go to your nearest emergency room or: Call 911. Call the National Suicide Prevention Lifeline at 2232757840 or 988. This is open 24 hours a day. Text the Crisis Text Line at 657-529-4371. Summary Insomnia is a sleep disorder that makes it difficult to fall asleep or stay asleep. Insomnia can be long-term (chronic) or short-term (acute). Treatment for insomnia depends on the cause. Treatment may focus on treating an underlying condition that is causing the insomnia. Keep a sleep diary to help you and your health care provider figure out what could be causing your insomnia. This information is not intended to replace advice given to you by your health care provider. Make sure you discuss any questions you have with your health care provider. Document Revised: 08/01/2021 Document Reviewed: 08/01/2021 Elsevier Patient Education  2024 [employer].

## 2023-09-14 NOTE — Progress Notes (Signed)
 Virtual Visit via Video Note  I connected with Crystal Freeman on 09/14/23 at 12:00 PM EST by a video enabled telemedicine application and verified that I am speaking with the correct person using two identifiers.  Location: Patient: Home Provider: Office  Persons participating in this video call: Angeline Laura, NP and Crystal Freeman   I discussed the limitations of evaluation and management by telemedicine and the availability of in person appointments. The patient expressed understanding and agreed to proceed.  History of Present Illness:   Discussed the use of AI scribe software for clinical note transcription with the patient, who gave verbal consent to proceed.  The patient presents with ongoing sleep disturbances, characterized by difficulty maintaining sleep. She reports being able to fall asleep initially, but then wakes up after only one to two hours and struggles to return to sleep. This issue has been ongoing and is believed to be related to recent stressful life events, including a divorce three years ago and a recent modification of child custody arrangements. The patient has not tried any over-the-counter sleep aids due to concerns about potential interactions with her current medications, which include an unspecified anxiety medication. The patient expresses a desire to avoid becoming dependent on a sleep aid medication.      Past Medical History:  Diagnosis Date   Migraine     Current Outpatient Medications  Medication Sig Dispense Refill   acetaminophen  (TYLENOL ) 500 MG tablet Take by mouth.     baclofen  (LIORESAL ) 10 MG tablet Take 1 tablet (10 mg total) by mouth 3 (three) times daily. 30 each 0   busPIRone  (BUSPAR ) 10 MG tablet Take 1 tablet (10 mg total) by mouth daily as needed. 90 tablet 1   norethindrone  (AYGESTIN ) 5 MG tablet Take 1 tablet (5 mg total) by mouth daily. 90 tablet 1   olmesartan  (BENICAR ) 20 MG tablet Take 1 tablet by mouth once daily 90 tablet 0    simvastatin  (ZOCOR ) 10 MG tablet TAKE 1 TABLET BY MOUTH THREE TIMES A WEEK 36 tablet 0   SUMAtriptan  (IMITREX ) 25 MG tablet TAKE 1 TABLET BY MOUTH ONCE AS NEEDED FOR MIGRAINE FOR UP TO 1 DOSE MAY TAKE SECOND DOSE AFTER 2 HOURS IF NEEDED. 10 tablet 5   No current facility-administered medications for this visit.    Allergies  Allergen Reactions   Statins     Other reaction(s): Muscle Pain   Sulfa Antibiotics Rash    Family History  Problem Relation Age of Onset   COPD Mother    Heart failure Mother    Diabetes Mother    Hypertension Mother    Depression Mother    Hypertension Father    Hyperlipidemia Father    Healthy Sister    Cancer - Colon Neg Hx    Breast cancer Neg Hx    Ovarian cancer Neg Hx     Social History   Socioeconomic History   Marital status: Divorced    Spouse name: Not on file   Number of children: Not on file   Years of education: Not on file   Highest education level: Associate degree: occupational, scientist, product/process development, or vocational program  Occupational History   Not on file  Tobacco Use   Smoking status: Every Day    Current packs/day: 0.75    Average packs/day: 0.8 packs/day for 20.0 years (15.0 ttl pk-yrs)    Types: Cigarettes   Smokeless tobacco: Never  Vaping Use   Vaping status: Never Used  Substance and  Sexual Activity   Alcohol use: Never   Drug use: Never   Sexual activity: Not Currently  Other Topics Concern   Not on file  Social History Narrative   Not on file   Social Drivers of Health   Financial Resource Strain: Medium Risk (08/27/2023)   Overall Financial Resource Strain (CARDIA)    Difficulty of Paying Living Expenses: Somewhat hard  Food Insecurity: No Food Insecurity (08/27/2023)   Hunger Vital Sign    Worried About Running Out of Food in the Last Year: Never true    Ran Out of Food in the Last Year: Never true  Transportation Needs: No Transportation Needs (08/27/2023)   PRAPARE - Administrator, Civil Service  (Medical): No    Lack of Transportation (Non-Medical): No  Physical Activity: Insufficiently Active (08/27/2023)   Exercise Vital Sign    Days of Exercise per Week: 3 days    Minutes of Exercise per Session: 30 min  Stress: No Stress Concern Present (08/27/2023)   [employer] of Occupational Health - Occupational Stress Questionnaire    Feeling of Stress : Only a little  Social Connections: Moderately Isolated (08/27/2023)   Social Connection and Isolation Panel [NHANES]    Frequency of Communication with Friends and Family: More than three times a week    Frequency of Social Gatherings with Friends and Family: Twice a week    Attends Religious Services: More than 4 times per year    Active Member of [employer] or Organizations: No    Attends Engineer, Structural: Not on file    Marital Status: Divorced  Intimate Partner Violence: Not on file     Constitutional: Patient reports intermittent headaches.  Denies fever, malaise, fatigue, or abrupt weight changes.  HEENT: Denies eye pain, eye redness, ear pain, ringing in the ears, wax buildup, runny nose, nasal congestion, bloody nose, or sore throat. Respiratory: Denies difficulty breathing, shortness of breath, cough or sputum production.   Cardiovascular: Denies chest pain, chest tightness, palpitations or swelling in the hands or feet.  Gastrointestinal: Denies abdominal pain, bloating, constipation, diarrhea or blood in the stool.  GU: Denies urgency, frequency, pain with urination, burning sensation, blood in urine, odor or discharge. Musculoskeletal: Denies decrease in range of motion, difficulty with gait, muscle pain or joint pain and swelling.  Skin: Denies redness, rashes, lesions or ulcercations.  Neurological: Pt reports insomnia. Denies dizziness, difficulty with memory, difficulty with speech or problems with balance and coordination.  Psych: Patient has a history of anxiety.  Denies depression, SI/HI.  No other  specific complaints in a complete review of systems (except as listed in HPI above).  Observations/Objective:   Wt Readings from Last 3 Encounters:  08/27/23 200 lb (90.7 kg)  05/01/23 199 lb 9.6 oz (90.5 kg)  12/01/22 202 lb (91.6 kg)    General: Appears her stated age, well developed, well nourished in NAD. Pulmonary/Chest: Normal effort. No respiratory distress.  Neurological: Alert and oriented.  Psychiatric: Mood and affect normal. Behavior is normal. Judgment and thought content normal.    BMET    Component Value Date/Time   NA 139 05/01/2023 1552   NA 142 04/26/2012 0123   K 3.8 05/01/2023 1552   K 3.3 (L) 04/26/2012 0123   CL 106 05/01/2023 1552   CL 109 (H) 04/26/2012 0123   CO2 24 05/01/2023 1552   CO2 24 04/26/2012 0123   GLUCOSE 92 05/01/2023 1552   GLUCOSE 100 (H) 04/26/2012 0123  BUN 7 05/01/2023 1552   BUN 7 04/26/2012 0123   CREATININE 0.90 05/01/2023 1552   CALCIUM  9.1 05/01/2023 1552   CALCIUM  9.3 04/26/2012 0123   GFRNONAA >60 04/26/2012 0123   GFRAA >60 04/26/2012 0123    Lipid Panel     Component Value Date/Time   CHOL 166 05/01/2023 1552   TRIG 209 (H) 05/01/2023 1552   HDL 25 (L) 05/01/2023 1552   CHOLHDL 6.6 (H) 05/01/2023 1552   LDLCALC 108 (H) 05/01/2023 1552    CBC    Component Value Date/Time   WBC 9.4 05/01/2023 1552   RBC 5.14 (H) 05/01/2023 1552   HGB 15.3 05/01/2023 1552   HGB 10.9 (L) 08/23/2012 2248   HCT 45.3 (H) 05/01/2023 1552   HCT 31.8 (L) 08/25/2012 0715   PLT 197 05/01/2023 1552   PLT 148 (L) 08/23/2012 2248   MCV 88.1 05/01/2023 1552   MCV 88 08/23/2012 2248   MCH 29.8 05/01/2023 1552   MCHC 33.8 05/01/2023 1552   RDW 12.4 05/01/2023 1552   RDW 13.2 08/23/2012 2248   LYMPHSABS 1,788 08/23/2022 0840   LYMPHSABS 3.0 08/23/2012 2248   MONOABS 0.7 08/23/2012 2248   EOSABS 61 08/23/2022 0840   EOSABS 0.1 08/23/2012 2248   BASOSABS 41 08/23/2022 0840   BASOSABS 0.0 08/23/2012 2248    Hgb A1C Lab Results   Component Value Date   HGBA1C 5.6 05/01/2023       Assessment and Plan:  Assessment and Plan    Insomnia Difficulty maintaining sleep, likely related to recent stressors including child custody issues. Patient is hesitant to try over-the-counter sleep aids due to potential interactions with current medications. Discussed options including melatonin and trazodone . Patient prefers not to become dependent on a medication. -Prescribe Trazodone  50mg , instruct to start with half a tablet as needed for sleep, not to exceed daily use. -Re-evaluate sleep issues at follow-up appointment in March.       RTC in 1 month for follow-up of chronic conditions  Follow Up Instructions:    I discussed the assessment and treatment plan with the patient. The patient was provided an opportunity to ask questions and all were answered. The patient agreed with the plan and demonstrated an understanding of the instructions.   The patient was advised to call back or seek an in-person evaluation if the symptoms worsen or if the condition fails to improve as anticipated.    Angeline Laura, NP

## 2023-09-18 ENCOUNTER — Encounter: Payer: Self-pay | Admitting: *Deleted

## 2023-09-27 ENCOUNTER — Telehealth: Payer: Self-pay

## 2023-09-27 ENCOUNTER — Other Ambulatory Visit: Payer: Self-pay | Admitting: *Deleted

## 2023-09-27 ENCOUNTER — Telehealth: Payer: Self-pay | Admitting: *Deleted

## 2023-09-27 DIAGNOSIS — Z1211 Encounter for screening for malignant neoplasm of colon: Secondary | ICD-10-CM

## 2023-09-27 MED ORDER — NA SULFATE-K SULFATE-MG SULF 17.5-3.13-1.6 GM/177ML PO SOLN: 1.0000 | Freq: Once | ORAL | 0 refills | Status: DC

## 2023-09-27 NOTE — Addendum Note (Signed)
Addended by: Tawnya Crook on: 09/27/2023 02:02 PM   Modules accepted: Orders

## 2023-09-27 NOTE — Telephone Encounter (Signed)
Gastroenterology Pre-Procedure Review  Request Date: 11/07/2023  Requesting Physician: Dr. Allegra Lai  PATIENT REVIEW QUESTIONS: The patient responded to the following health history questions as indicated:    1. Are you having any GI issues? no 2. Do you have a personal history of Polyps? no 3. Do you have a family history of Colon Cancer or Polyps? no 4. Diabetes Mellitus? no 5. Joint replacements in the past 12 months?no 6. Major health problems in the past 3 months?no 7. Any artificial heart valves, MVP, or defibrillator?no    MEDICATIONS & ALLERGIES:    Patient reports the following regarding taking any anticoagulation/antiplatelet therapy:   Plavix, Coumadin, Eliquis, Xarelto, Lovenox, Pradaxa, Brilinta, or Effient? no Aspirin? no  Patient confirms/reports the following medications:  Current Outpatient Medications  Medication Sig Dispense Refill   acetaminophen (TYLENOL) 500 MG tablet Take by mouth.     baclofen (LIORESAL) 10 MG tablet Take 1 tablet (10 mg total) by mouth 3 (three) times daily. 30 each 0   busPIRone (BUSPAR) 10 MG tablet Take 1 tablet (10 mg total) by mouth daily as needed. 90 tablet 1   norethindrone (AYGESTIN) 5 MG tablet Take 1 tablet (5 mg total) by mouth daily. 90 tablet 1   olmesartan (BENICAR) 20 MG tablet Take 1 tablet by mouth once daily 90 tablet 0   simvastatin (ZOCOR) 10 MG tablet TAKE 1 TABLET BY MOUTH THREE TIMES A WEEK 36 tablet 0   SUMAtriptan (IMITREX) 25 MG tablet TAKE 1 TABLET BY MOUTH ONCE AS NEEDED FOR MIGRAINE FOR UP TO 1 DOSE MAY TAKE SECOND DOSE AFTER 2 HOURS IF NEEDED. 10 tablet 5   traZODone (DESYREL) 50 MG tablet Take 0.5-1 tablets (25-50 mg total) by mouth at bedtime as needed for sleep. 30 tablet 0   No current facility-administered medications for this visit.    Patient confirms/reports the following allergies:  Allergies  Allergen Reactions   Statins     Other reaction(s): Muscle Pain   Sulfa Antibiotics Rash    No orders of  the defined types were placed in this encounter.   AUTHORIZATION INFORMATION Primary Insurance: 1D#: Group #:  Secondary Insurance: 1D#: Group #:  SCHEDULE INFORMATION: Date: 11/07/2023 Time: Location:  [facility]

## 2023-09-27 NOTE — Telephone Encounter (Signed)
The patient called in and left a voicemail requesting to schedule colonoscopy. I called her back to let her know we received her message, and I sent the message to [place].

## 2023-09-27 NOTE — Telephone Encounter (Signed)
Colonoscopy schedule on 11/07/2023 with Dr Allegra Lai at [facility]

## 2023-10-02 ENCOUNTER — Ambulatory Visit: Payer: 59 | Admitting: Internal Medicine

## 2023-10-02 ENCOUNTER — Encounter: Payer: Self-pay | Admitting: Internal Medicine

## 2023-10-02 VITALS — BP 130/78 | Ht 67.0 in | Wt 203.4 lb

## 2023-10-02 DIAGNOSIS — R202 Paresthesia of skin: Secondary | ICD-10-CM | POA: Diagnosis not present

## 2023-10-02 DIAGNOSIS — M79601 Pain in right arm: Secondary | ICD-10-CM

## 2023-10-02 MED ORDER — PREDNISONE 10 MG PO TABS: ORAL_TABLET | ORAL | 0 refills | Status: DC

## 2023-10-02 NOTE — Progress Notes (Signed)
Subjective:    Patient ID: Crystal Freeman, female    DOB: 1976-06-28, 48 y.o.   MRN: [id number]  HPI  Discussed the use of AI scribe software for clinical note transcription with the patient, who gave verbal consent to proceed.  The patient presents with right arm pain for the past month.  She has been experiencing sharp, stabbing pain in the right forearm for the past month, which worsens at night and is less severe during the day. The pain is localized to the forearm and does not radiate.  In addition to the pain, she has numbness and tingling in the right arm extending into the hand, particularly over the last few nights. No neck pain, injury to the area, or rash is reported. During specific movements or positions, no numbness or tingling is noted.  She has tried various over-the-counter treatments, including Tylenol, ibuprofen, heating pads, and cold pads, but none have provided relief.  She is right-handed and frequently uses her right arm, although she does not engage in repetitive tasks at work. Occasionally, she performs restraints or holds.      Review of Systems     Past Medical History:  Diagnosis Date   Migraine     Current Outpatient Medications  Medication Sig Dispense Refill   acetaminophen (TYLENOL) 500 MG tablet Take by mouth.     baclofen (LIORESAL) 10 MG tablet Take 1 tablet (10 mg total) by mouth 3 (three) times daily. 30 each 0   busPIRone (BUSPAR) 10 MG tablet Take 1 tablet (10 mg total) by mouth daily as needed. 90 tablet 1   norethindrone (AYGESTIN) 5 MG tablet Take 1 tablet (5 mg total) by mouth daily. 90 tablet 1   olmesartan (BENICAR) 20 MG tablet Take 1 tablet by mouth once daily 90 tablet 0   simvastatin (ZOCOR) 10 MG tablet TAKE 1 TABLET BY MOUTH THREE TIMES A WEEK 36 tablet 0   SUMAtriptan (IMITREX) 25 MG tablet TAKE 1 TABLET BY MOUTH ONCE AS NEEDED FOR MIGRAINE FOR UP TO 1 DOSE MAY TAKE SECOND DOSE AFTER 2 HOURS IF NEEDED. 10 tablet 5   traZODone  (DESYREL) 50 MG tablet Take 0.5-1 tablets (25-50 mg total) by mouth at bedtime as needed for sleep. 30 tablet 0   No current facility-administered medications for this visit.    Allergies  Allergen Reactions   Statins     Other reaction(s): Muscle Pain   Sulfa Antibiotics Rash    Family History  Problem Relation Age of Onset   COPD Mother    Heart failure Mother    Diabetes Mother    Hypertension Mother    Depression Mother    Hypertension Father    Hyperlipidemia Father    Healthy Sister    Cancer - Colon Neg Hx    Breast cancer Neg Hx    Ovarian cancer Neg Hx     Social History   Socioeconomic History   Marital status: Divorced    Spouse name: Not on file   Number of children: Not on file   Years of education: Not on file   Highest education level: Associate degree: occupational, Scientist, product/process development, or vocational program  Occupational History   Not on file  Tobacco Use   Smoking status: Every Day    Current packs/day: 0.75    Average packs/day: 0.8 packs/day for 20.0 years (15.0 ttl pk-yrs)    Types: Cigarettes   Smokeless tobacco: Never  Vaping Use   Vaping status: Never Used  Substance and Sexual Activity   Alcohol use: Never   Drug use: Never   Sexual activity: Not Currently  Other Topics Concern   Not on file  Social History Narrative   Not on file   Social Drivers of Health   Financial Resource Strain: Medium Risk (08/27/2023)   Overall Financial Resource Strain (CARDIA)    Difficulty of Paying Living Expenses: Somewhat hard  Food Insecurity: No Food Insecurity (08/27/2023)   Hunger Vital Sign    Worried About Running Out of Food in the Last Year: Never true    Ran Out of Food in the Last Year: Never true  Transportation Needs: No Transportation Needs (08/27/2023)   PRAPARE - Administrator, Civil Service (Medical): No    Lack of Transportation (Non-Medical): No  Physical Activity: Insufficiently Active (08/27/2023)   Exercise Vital Sign     Days of Exercise per Week: 3 days    Minutes of Exercise per Session: 30 min  Stress: No Stress Concern Present (08/27/2023)   [employer] of Occupational Health - Occupational Stress Questionnaire    Feeling of Stress : Only a little  Social Connections: Moderately Isolated (08/27/2023)   Social Connection and Isolation Panel [NHANES]    Frequency of Communication with Friends and Family: More than three times a week    Frequency of Social Gatherings with Friends and Family: Twice a week    Attends Religious Services: More than 4 times per year    Active Member of [employer] or Organizations: No    Attends Engineer, structural: Not on file    Marital Status: Divorced  Intimate Partner Violence: Not on file     Constitutional: Patient reports intermittent headaches.  Denies fever, malaise, fatigue, or abrupt weight changes.  Respiratory: Denies difficulty breathing, shortness of breath, cough or sputum production.   Cardiovascular: Denies chest pain, chest tightness, palpitations or swelling in the hands or feet.  Musculoskeletal: Pt reports right arm pain. Denies decrease in range of motion, difficulty with gait, muscle pain or joint swelling.  Skin: Denies redness, rashes, lesions or ulcercations.  Neurological: Pt reports insomnia, paresthesia of right upper extremity. Denies dizziness, difficulty with memory, difficulty with speech or problems with balance and coordination.   No other specific complaints in a complete review of systems (except as listed in HPI above).  Objective:   Physical Exam  BP 130/78 (BP Location: Left Arm, Patient Position: Sitting, Cuff Size: Normal)   Ht 5\' 7"  (1.702 m)   Wt 203 lb 6.4 oz (92.3 kg)   BMI 31.86 kg/m    Wt Readings from Last 3 Encounters:  08/27/23 200 lb (90.7 kg)  05/01/23 199 lb 9.6 oz (90.5 kg)  12/01/22 202 lb (91.6 kg)    General: Appears her stated age,obese, in NAD. Skin: Warm, dry and intact. Cardiovascular:  Normal rate and rhythm. S1,S2 noted.  No murmur, rubs or gallops noted. Radial pulse 2+ on the right.  Pulmonary/Chest: Normal effort and positive vesicular breath sounds. No respiratory distress.  Musculoskeletal: Normal flexion, extension and rotation of the cervical spine. No bony tenderness noted over the cervical spine. Shoulder shrug equal. Normal internal and external rotation of the right shoulder. No pain with palpation of the shoulder. Negative drop can test on the right. Strength 5/5 BUE/BLE. Hand grips equal.  No difficulty with gait.  Neurological: Alert and oriented. Negative Phalen's. Negative Tinel's. Coordination normal.  Psychiatric: Mood and affect normal. Behavior is normal. Judgment and thought  content normal.    BMET    Component Value Date/Time   NA 139 05/01/2023 1552   NA 142 04/26/2012 0123   K 3.8 05/01/2023 1552   K 3.3 (L) 04/26/2012 0123   CL 106 05/01/2023 1552   CL 109 (H) 04/26/2012 0123   CO2 24 05/01/2023 1552   CO2 24 04/26/2012 0123   GLUCOSE 92 05/01/2023 1552   GLUCOSE 100 (H) 04/26/2012 0123   BUN 7 05/01/2023 1552   BUN 7 04/26/2012 0123   CREATININE 0.90 05/01/2023 1552   CALCIUM 9.1 05/01/2023 1552   CALCIUM 9.3 04/26/2012 0123   GFRNONAA >60 04/26/2012 0123   GFRAA >60 04/26/2012 0123    Lipid Panel     Component Value Date/Time   CHOL 166 05/01/2023 1552   TRIG 209 (H) 05/01/2023 1552   HDL 25 (L) 05/01/2023 1552   CHOLHDL 6.6 (H) 05/01/2023 1552   LDLCALC 108 (H) 05/01/2023 1552    CBC    Component Value Date/Time   WBC 9.4 05/01/2023 1552   RBC 5.14 (H) 05/01/2023 1552   HGB 15.3 05/01/2023 1552   HGB 10.9 (L) 08/23/2012 2248   HCT 45.3 (H) 05/01/2023 1552   HCT 31.8 (L) 08/25/2012 0715   PLT 197 05/01/2023 1552   PLT 148 (L) 08/23/2012 2248   MCV 88.1 05/01/2023 1552   MCV 88 08/23/2012 2248   MCH 29.8 05/01/2023 1552   MCHC 33.8 05/01/2023 1552   RDW 12.4 05/01/2023 1552   RDW 13.2 08/23/2012 2248   LYMPHSABS  1,788 08/23/2022 0840   LYMPHSABS 3.0 08/23/2012 2248   MONOABS 0.7 08/23/2012 2248   EOSABS 61 08/23/2022 0840   EOSABS 0.1 08/23/2012 2248   BASOSABS 41 08/23/2022 0840   BASOSABS 0.0 08/23/2012 2248    Hgb A1C Lab Results  Component Value Date   HGBA1C 5.6 05/01/2023       Assessment and Plan    Right Arm Pain/Paresthesia Sharp, stabbing pain with associated numbness and tingling, worse at night. No associated neck pain or rash. No improvement with over-the-counter treatments. Physical examination suggests possible tendonitis. -Start Prednisone taper for 9 days to reduce inflammation. -Advise to use ice instead of heat. -Recommend minimizing repetitive motions with right arm at work. -If no improvement, consider physical therapy or referral to orthopedics.       RTC in 2 months, follow-up chronic conditions Nicki Reaper, NP

## 2023-10-02 NOTE — Patient Instructions (Signed)
Shoulder Range of Motion Exercises Shoulder range of motion (ROM) exercises are done to keep the shoulder moving freely or to increase movement. They are recommended for people who have shoulder pain or stiffness or who are recovering from a shoulder surgery. Ask your health care provider which exercises are safe for you. Do exercises exactly as told by your health care provider and adjust them as directed. It is normal to feel mild stretching, pulling, tightness, or discomfort as you do these exercises. Stop right away if you feel sudden pain or your pain gets worse. Do not begin these exercises until told by your health care provider. Phase 1 exercise When you are able, do this exercise 1-2 times a day for 30-60 seconds in each direction, or as directed by your health care provider. Pendulum exercise     To do this exercise while sitting: Sit in a chair or at the edge of your bed with your feet flat on the floor. Let your affected arm hang down in front of you over the edge of the bed or chair. Relax your shoulder, arm, and hand. Rock your body so your arm gently swings in small circles. You can also use your unaffected arm to start the motion. Repeat, changing the direction of the circles, swinging your arm left and right, and swinging your arm forward and back. To do this exercise while standing: Stand next to a sturdy chair or table, and hold on to it with your hand on your unaffected side. Bend forward at the waist. Bend your knees slightly. Relax your shoulder, arm, and hand. While keeping your shoulder relaxed, use body motion to swing your arm in small circles. Repeat, changing the direction of the circles, swinging your arm left and right, and swinging your arm forward and back. Between exercises, stand up tall and take a short break to relax your lower back.  Phase 2 exercises Do these exercises 1-2 times a day or as told by your health care provider. Hold each stretch for 30  seconds, and repeat 3 times. Do the exercises with one or both arms as instructed by your health care provider. For these exercises, sit at a table with your hand and arm supported by the table. A chair that slides easily or has wheels can be helpful. External rotation  Turn your chair so that your affected side is nearest to the table. Place your forearm on the table to your side. Bend your arm to about a 90-degree angle (right angle) at the elbow, and place your hand palm-down on the table. Your elbow should be about 6 inches (15 cm) away from your side. Keeping your arm on the table, lean your body forward. Abduction  Turn your chair so that your affected side is nearest to the table. Place your forearm and hand on the table so that your thumb points toward the ceiling and your arm is straight out to your side. Slide your hand out to the side and away from you. To increase the stretch, you can slide your chair away from the table. Flexion: forward stretch  Sit facing the table. Place your hand and elbow on the table in front of you. Slide your hand forward and away from you, using your unaffected arm to do the work. To increase the stretch, you can slide your chair backward. Phase 3 exercises Do these exercises 1-2 times a day or as told by your health care provider. Hold each stretch for 30 seconds, and  repeat 3 times. Do the exercises with one or both arms as instructed by your health care provider. You will need a cane, a piece of PVC pipe, or a sturdy wooden dowel for the wand exercises. Cross-body stretch: posterior capsule stretch  Lift your arm straight out in front of you. Bend your arm in a 90-degree angle (right angle) at the elbow so your forearm moves across your body. Use your other arm to gently pull the elbow across your body, toward your other shoulder. Wall climbs  Stand with your affected arm extended out to the side with your hand resting on a door frame. Slide your  hand slowly up the door frame. To increase the stretch, step through the door frame. Keep your body upright and do not lean. Flexion     To do this exercise while standing: Hold the wand with both of your hands, palms-down. Lift the wand up and over your head, if able. Lift mostly with your affected arm, and use the other arm to help. Push upward with your other arm to gently increase the stretch. To do this exercise while lying down: Lie on your back with your elbows resting on the floor and the wand in both your hands. Your hands will be palm-down, or pointing toward your feet. Lift your hands toward the ceiling, using your unaffected arm to help if needed. Bring your arms overhead as able, using your unaffected arm to help if needed.  Internal rotation  Stand while holding the wand behind you with both hands. Your unaffected arm should be extended above your head with the arm of the affected side extended behind you at the level of your waist. The wand should be pointing straight up and down as you hold it. Slowly pull the wand up behind your back by straightening the elbow of your unaffected arm and bending the elbow of your affected arm. External rotation  Lie on your back with your affected upper arm supported on a small pillow or rolled towel. When you first do this exercise, keep your upper arm close to your body. Over time, bring your arm up to a 90-degree angle (right angle) out to the side. Hold the wand across your stomach and with both hands palm-up. Your elbow on your affected side should be bent at a 90-degree angle. Use your unaffected side to help push your forearm away from you and toward the floor. Keep your elbow on your affected side bent at a 90-degree angle. This information is not intended to replace advice given to you by your health care provider. Make sure you discuss any questions you have with your health care provider. Document Revised: 10/11/2021 Document  Reviewed: 10/11/2021 Elsevier Patient Education  2024 [employer].

## 2023-10-17 ENCOUNTER — Other Ambulatory Visit: Payer: Self-pay

## 2023-10-24 ENCOUNTER — Encounter: Payer: Self-pay | Admitting: Internal Medicine

## 2023-10-29 ENCOUNTER — Telehealth: Payer: 59 | Admitting: Physician Assistant

## 2023-10-29 DIAGNOSIS — R3989 Other symptoms and signs involving the genitourinary system: Secondary | ICD-10-CM

## 2023-10-29 MED ORDER — NITROFURANTOIN MONOHYD MACRO 100 MG PO CAPS: 100.0000 mg | ORAL_CAPSULE | Freq: Two times a day (BID) | ORAL | 0 refills | Status: DC

## 2023-10-29 NOTE — Progress Notes (Signed)

## 2023-11-05 ENCOUNTER — Ambulatory Visit: Payer: BC Managed Care – PPO | Admitting: Internal Medicine

## 2023-11-06 ENCOUNTER — Encounter: Payer: Self-pay | Admitting: Gastroenterology

## 2023-11-06 ENCOUNTER — Ambulatory Visit: Payer: BC Managed Care – PPO | Admitting: Internal Medicine

## 2023-11-06 ENCOUNTER — Encounter: Payer: Self-pay | Admitting: Internal Medicine

## 2023-11-06 VITALS — BP 132/82 | Ht 67.0 in | Wt 200.8 lb

## 2023-11-06 DIAGNOSIS — G5621 Lesion of ulnar nerve, right upper limb: Secondary | ICD-10-CM

## 2023-11-06 DIAGNOSIS — F411 Generalized anxiety disorder: Secondary | ICD-10-CM

## 2023-11-06 DIAGNOSIS — R739 Hyperglycemia, unspecified: Secondary | ICD-10-CM | POA: Diagnosis not present

## 2023-11-06 DIAGNOSIS — F5104 Psychophysiologic insomnia: Secondary | ICD-10-CM

## 2023-11-06 DIAGNOSIS — G43C1 Periodic headache syndromes in child or adult, intractable: Secondary | ICD-10-CM

## 2023-11-06 DIAGNOSIS — E66811 Obesity, class 1: Secondary | ICD-10-CM

## 2023-11-06 DIAGNOSIS — I10 Essential (primary) hypertension: Secondary | ICD-10-CM

## 2023-11-06 DIAGNOSIS — Z6831 Body mass index (BMI) 31.0-31.9, adult: Secondary | ICD-10-CM

## 2023-11-06 DIAGNOSIS — D751 Secondary polycythemia: Secondary | ICD-10-CM

## 2023-11-06 DIAGNOSIS — E7800 Pure hypercholesterolemia, unspecified: Secondary | ICD-10-CM | POA: Diagnosis not present

## 2023-11-06 DIAGNOSIS — E6609 Other obesity due to excess calories: Secondary | ICD-10-CM

## 2023-11-06 NOTE — Progress Notes (Unsigned)
 Subjective:    Patient ID: Crystal Freeman, female    DOB: [birth year], 48 y.o.   MRN: [id number]  HPI  Patient presents to clinic today for follow-up of chronic conditions.  Migraines: Triggered by hormones, lack of caffeine and not eating consistently.  These occur rarely.  She takes imitrex as needed with good relief of symptoms.  She does not follow with neurology.  GAD: Persistent, managed with buspirone as needed.  She is not currently seeing a therapist.  She denies depression, SI/HI.  Polycythemia: Her last H/H was 15.3/45.3, 04/2023.  She does smoke.  She does not follow with hematology.  HLD: Her last LDL was 108, triglycerides 209, 04/2023.  She denies myalgias on simvastatin.  She does not consume low-fat diet.  HTN: Her BP today is 132/82.  She is taking olmesartan as prescribed.  There is no ECG on file.  Insomnia: She has difficulty.  She is taking trazodone as prescribed.  There is no sleep study on file.  She is having persistent right arm pain, numbness and tingling.  This improved after a course of prednisone but then the pain became more intense.  She would like referral to orthopedics at this time for further evaluation.  Review of Systems     Past Medical History:  Diagnosis Date   Migraine     Current Outpatient Medications  Medication Sig Dispense Refill   acetaminophen (TYLENOL) 500 MG tablet Take by mouth.     baclofen (LIORESAL) 10 MG tablet Take 1 tablet (10 mg total) by mouth 3 (three) times daily. (Patient not taking: Reported on 10/02/2023) 30 each 0   busPIRone (BUSPAR) 10 MG tablet Take 1 tablet (10 mg total) by mouth daily as needed. 90 tablet 1   nitrofurantoin, macrocrystal-monohydrate, (MACROBID) 100 MG capsule Take 1 capsule (100 mg total) by mouth 2 (two) times daily. 10 capsule 0   norethindrone (AYGESTIN) 5 MG tablet Take 1 tablet (5 mg total) by mouth daily. 90 tablet 1   olmesartan (BENICAR) 20 MG tablet Take 1 tablet by mouth once daily 90  tablet 0   predniSONE (DELTASONE) 10 MG tablet Take 3 tabs on days 1-3, 2 tabs on days 4-6, 1 tab on days 7-9 18 tablet 0   simvastatin (ZOCOR) 10 MG tablet TAKE 1 TABLET BY MOUTH THREE TIMES A WEEK 36 tablet 0   SUMAtriptan (IMITREX) 25 MG tablet TAKE 1 TABLET BY MOUTH ONCE AS NEEDED FOR MIGRAINE FOR UP TO 1 DOSE MAY TAKE SECOND DOSE AFTER 2 HOURS IF NEEDED. 10 tablet 5   traZODone (DESYREL) 50 MG tablet Take 0.5-1 tablets (25-50 mg total) by mouth at bedtime as needed for sleep. 30 tablet 0   No current facility-administered medications for this visit.    Allergies  Allergen Reactions   Statins     Other reaction(s): Muscle Pain   Sulfa Antibiotics Rash    Family History  Problem Relation Age of Onset   COPD Mother    Heart failure Mother    Diabetes Mother    Hypertension Mother    Depression Mother    Hypertension Father    Hyperlipidemia Father    Healthy Sister    Cancer - Colon Neg Hx    Breast cancer Neg Hx    Ovarian cancer Neg Hx     Social History   Socioeconomic History   Marital status: Divorced    Spouse name: Not on file   Number of children: Not on file  Years of education: Not on file   Highest education level: Associate degree: occupational, technical, or vocational program  Occupational History   Not on file  Tobacco Use   Smoking status: Every Day    Current packs/day: 0.75    Average packs/day: 0.8 packs/day for 20.0 years (15.0 ttl pk-yrs)    Types: Cigarettes   Smokeless tobacco: Never  Vaping Use   Vaping status: Never Used  Substance and Sexual Activity   Alcohol use: Never   Drug use: Never   Sexual activity: Not Currently  Other Topics Concern   Not on file  Social History Narrative   Not on file   Social Drivers of Health   Financial Resource Strain: Medium Risk (08/27/2023)   Overall Financial Resource Strain (CARDIA)    Difficulty of Paying Living Expenses: Somewhat hard  Food Insecurity: No Food Insecurity (08/27/2023)    Hunger Vital Sign    Worried About Running Out of Food in the Last Year: Never true    Ran Out of Food in the Last Year: Never true  Transportation Needs: No Transportation Needs (08/27/2023)   PRAPARE - Administrator, Civil Service (Medical): No    Lack of Transportation (Non-Medical): No  Physical Activity: Insufficiently Active (08/27/2023)   Exercise Vital Sign    Days of Exercise per Week: 3 days    Minutes of Exercise per Session: 30 min  Stress: No Stress Concern Present (08/27/2023)   [employer] of Occupational Health - Occupational Stress Questionnaire    Feeling of Stress : Only a little  Social Connections: Moderately Isolated (08/27/2023)   Social Connection and Isolation Panel [NHANES]    Frequency of Communication with Friends and Family: More than three times a week    Frequency of Social Gatherings with Friends and Family: Twice a week    Attends Religious Services: More than 4 times per year    Active Member of [employer] or Organizations: No    Attends Engineer, structural: Not on file    Marital Status: Divorced  Intimate Partner Violence: Not on file     Constitutional: Patient reports intermittent headaches.  Denies fever, malaise, fatigue, or abrupt weight changes.  HEENT: Denies eye pain, eye redness, ear pain, ringing in the ears, wax buildup, runny nose, nasal congestion, bloody nose, or sore throat. Respiratory: Denies difficulty breathing, shortness of breath, cough or sputum production.   Cardiovascular: Denies chest pain, chest tightness, palpitations or swelling in the hands or feet.  Gastrointestinal: Denies abdominal pain, bloating, constipation, diarrhea or blood in the stool.  GU: Denies urgency, frequency, pain with urination, burning sensation, blood in urine, odor or discharge. Musculoskeletal: Patient reports right arm pain.  Denies decrease in range of motion, difficulty with gait, or joint swelling.  Skin: Denies redness,  rashes, lesions or ulcercations.  Neurological: Patient reports insomnia, paresthesia of right upper extremity.  Denies dizziness, difficulty with memory, difficulty with speech or problems with balance and coordination.  Psych: Patient has a history of anxiety.  Denies depression, SI/HI.  No other specific complaints in a complete review of systems (except as listed in HPI above).  Objective:   Physical Exam  BP 132/82 (BP Location: Left Arm, Patient Position: Sitting, Cuff Size: Normal)   Ht 5\' 7"  (1.702 m)   Wt 200 lb 12.8 oz (91.1 kg)   LMP 10/09/2023 (Approximate)   BMI 31.45 kg/m    Wt Readings from Last 3 Encounters:  10/02/23 203 lb 6.4  oz (92.3 kg)  08/27/23 200 lb (90.7 kg)  05/01/23 199 lb 9.6 oz (90.5 kg)    General: Appears her stated age, obese in NAD. Skin: Warm, dry and intact.  HEENT: Head: normal shape and size; Eyes: sclera white, no icterus, conjunctiva pink, PERRLA and EOMs intact;  Cardiovascular: Normal rate and rhythm. S1,S2 noted.  No murmur, rubs or gallops noted.  Pulmonary/Chest: Normal effort and positive vesicular breath sounds. No respiratory distress. No wheezes, rales or ronchi noted.  Musculoskeletal: Normal flexion, extension and rotation of the right elbow.  No pain with palpation of the right elbow.  Pain with palpation over the medial tendon.  Handgrips equal.  No difficulty with gait.  Neurological: Alert and oriented. Coordination normal.  Psychiatric: Mood and affect normal. Behavior is normal. Judgment and thought content normal.    BMET    Component Value Date/Time   NA 139 05/01/2023 1552   NA 142 04/26/2012 0123   K 3.8 05/01/2023 1552   K 3.3 (L) 04/26/2012 0123   CL 106 05/01/2023 1552   CL 109 (H) 04/26/2012 0123   CO2 24 05/01/2023 1552   CO2 24 04/26/2012 0123   GLUCOSE 92 05/01/2023 1552   GLUCOSE 100 (H) 04/26/2012 0123   BUN 7 05/01/2023 1552   BUN 7 04/26/2012 0123   CREATININE 0.90 05/01/2023 1552   CALCIUM 9.1  05/01/2023 1552   CALCIUM 9.3 04/26/2012 0123   GFRNONAA >60 04/26/2012 0123   GFRAA >60 04/26/2012 0123    Lipid Panel     Component Value Date/Time   CHOL 166 05/01/2023 1552   TRIG 209 (H) 05/01/2023 1552   HDL 25 (L) 05/01/2023 1552   CHOLHDL 6.6 (H) 05/01/2023 1552   LDLCALC 108 (H) 05/01/2023 1552    CBC    Component Value Date/Time   WBC 9.4 05/01/2023 1552   RBC 5.14 (H) 05/01/2023 1552   HGB 15.3 05/01/2023 1552   HGB 10.9 (L) 08/23/2012 2248   HCT 45.3 (H) 05/01/2023 1552   HCT 31.8 (L) 08/25/2012 0715   PLT 197 05/01/2023 1552   PLT 148 (L) 08/23/2012 2248   MCV 88.1 05/01/2023 1552   MCV 88 08/23/2012 2248   MCH 29.8 05/01/2023 1552   MCHC 33.8 05/01/2023 1552   RDW 12.4 05/01/2023 1552   RDW 13.2 08/23/2012 2248   LYMPHSABS 1,788 08/23/2022 0840   LYMPHSABS 3.0 08/23/2012 2248   MONOABS 0.7 08/23/2012 2248   EOSABS 61 08/23/2022 0840   EOSABS 0.1 08/23/2012 2248   BASOSABS 41 08/23/2022 0840   BASOSABS 0.0 08/23/2012 2248    Hgb A1C Lab Results  Component Value Date   HGBA1C 5.6 05/01/2023            Assessment & Plan:   Ulnar neuropathy:  Failed conservative treatment Referral to orthopedics for further evaluation and treatment   RTC in 6 months for your annual exam Nicki Reaper, NP

## 2023-11-07 ENCOUNTER — Ambulatory Visit: Admission: RE | Admit: 2023-11-07 | Payer: 59 | Source: Home / Self Care | Admitting: Gastroenterology

## 2023-11-07 ENCOUNTER — Encounter: Payer: Self-pay | Admitting: Internal Medicine

## 2023-11-07 ENCOUNTER — Encounter: Admission: RE | Payer: Self-pay | Source: Home / Self Care

## 2023-11-07 LAB — COMPLETE METABOLIC PANEL WITH GFR
AG Ratio: 2 (calc) (ref 1.0–2.5)
ALT: 10 U/L (ref 6–29)
AST: 14 U/L (ref 10–35)
Albumin: 4.5 g/dL (ref 3.6–5.1)
Alkaline phosphatase (APISO): 74 U/L (ref 31–125)
BUN: 12 mg/dL (ref 7–25)
CO2: 27 mmol/L (ref 20–32)
Calcium: 9.2 mg/dL (ref 8.6–10.2)
Chloride: 103 mmol/L (ref 98–110)
Creat: 0.96 mg/dL (ref 0.50–0.99)
Globulin: 2.2 g/dL (ref 1.9–3.7)
Glucose, Bld: 97 mg/dL (ref 65–139)
Potassium: 3.8 mmol/L (ref 3.5–5.3)
Sodium: 140 mmol/L (ref 135–146)
Total Bilirubin: 0.4 mg/dL (ref 0.2–1.2)
Total Protein: 6.7 g/dL (ref 6.1–8.1)
eGFR: 74 mL/min/{1.73_m2} (ref >=60)

## 2023-11-07 LAB — CBC
HCT: 44.8 % (ref 35.0–45.0)
Hemoglobin: 15.1 g/dL (ref 11.7–15.5)
MCH: 30.3 pg (ref 27.0–33.0)
MCHC: 33.7 g/dL (ref 32.0–36.0)
MCV: 89.8 fL (ref 80.0–100.0)
MPV: 11.3 fL (ref 7.5–12.5)
Platelets: 199 10*3/uL (ref 140–400)
RBC: 4.99 10*6/uL (ref 3.80–5.10)
RDW: 12.1 % (ref 11.0–15.0)
WBC: 9 10*3/uL (ref 3.8–10.8)

## 2023-11-07 LAB — LIPID PANEL
Cholesterol: 174 mg/dL (ref <200)
HDL: 27 mg/dL — ABNORMAL LOW (ref >=50)
LDL Cholesterol (Calc): 117 mg/dL — ABNORMAL HIGH
Non-HDL Cholesterol (Calc): 147 mg/dL — ABNORMAL HIGH (ref <130)
Total CHOL/HDL Ratio: 6.4 (calc) — ABNORMAL HIGH (ref <5.0)
Triglycerides: 188 mg/dL — ABNORMAL HIGH (ref <150)

## 2023-11-07 LAB — IRON,TIBC AND FERRITIN PANEL
%SAT: 14 % — ABNORMAL LOW (ref 16–45)
Ferritin: 104 ng/mL (ref 16–232)
Iron: 44 ug/dL (ref 40–190)
TIBC: 318 ug/dL (ref 250–450)

## 2023-11-07 LAB — HEMOGLOBIN A1C
Hgb A1c MFr Bld: 5.6 %{Hb} (ref <5.7)
Mean Plasma Glucose: 114 mg/dL
eAG (mmol/L): 6.3 mmol/L

## 2023-11-07 SURGERY — COLONOSCOPY WITH PROPOFOL
Anesthesia: General

## 2023-11-07 NOTE — Assessment & Plan Note (Signed)
 Continue Imitrex as needed

## 2023-11-07 NOTE — Assessment & Plan Note (Signed)
Continue buspirone as needed Support offered

## 2023-11-07 NOTE — Assessment & Plan Note (Signed)
CBC today Encourage smoking cessation Advised monthly blood donation

## 2023-11-07 NOTE — Patient Instructions (Signed)

## 2023-11-07 NOTE — Assessment & Plan Note (Signed)
C-Met and lipid profile today Encouraged her to consume a low-fat diet Continue simvastatin 

## 2023-11-07 NOTE — Assessment & Plan Note (Signed)
Controlled on olmesartan Reinforced DASH diet and exercise for weight loss 

## 2023-11-07 NOTE — Assessment & Plan Note (Signed)
 Continue trazodone as prescribed Will monitor

## 2023-11-07 NOTE — Assessment & Plan Note (Signed)
 Encourage diet and exercise for weight loss

## 2023-12-24 ENCOUNTER — Encounter: Payer: Self-pay | Admitting: Internal Medicine

## 2023-12-25 ENCOUNTER — Telehealth (INDEPENDENT_AMBULATORY_CARE_PROVIDER_SITE_OTHER): Admitting: Internal Medicine

## 2023-12-25 ENCOUNTER — Encounter: Payer: Self-pay | Admitting: Internal Medicine

## 2023-12-25 DIAGNOSIS — M549 Dorsalgia, unspecified: Secondary | ICD-10-CM

## 2023-12-25 DIAGNOSIS — R202 Paresthesia of skin: Secondary | ICD-10-CM | POA: Diagnosis not present

## 2023-12-25 NOTE — Progress Notes (Addendum)
 Virtual Visit via Video Note  I connected with Crystal Freeman on 12/25/23 at 10:40 AM EDT by a video enabled telemedicine application and verified that I am speaking with the correct person using two identifiers.  Location: Patient: Work Provider: Engineer, structural in this video call: Helayne Lo, NP-[employer]   I discussed the limitations of evaluation and management by telemedicine and the availability of in person appointments. The patient expressed understanding and agreed to proceed.  History of Present Illness:  Discussed the use of AI scribe software for clinical note transcription with the patient, who gave verbal consent to proceed.   She is a 48 year old female who presents with worsening right arm pain, numbness, and tingling of the right upper extremity.  Since December, she has experienced worsening right arm pain, numbness, and tingling, severe enough to disrupt her sleep, forcing her to sleep in a recliner as she cannot lie flat on her back or side.  She was evaluated by [facility], where it was suggested that the symptoms might be due to a pinched nerve originating from her neck. 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  She visited the ER on Sunday due to severe pain and was referred to a spine center, with an appointment scheduled for tomorrow.  She started a stronger steroid than prednisone  prescribed a her earlier consult with [facility], which has led to slight improvement in pain and sleep. She is also taking gabapentin, 100 mg 3 x day prescribed at the ER on Sunday.   No neck or shoulder pain is reported, but she describes pain at the base of her neck and upper back near the shoulder blade.        Past Medical History:  Diagnosis Date   Migraine     Current Outpatient Medications  Medication Sig Dispense Refill   acetaminophen  (TYLENOL ) 500 MG tablet Take by mouth.     busPIRone   (BUSPAR ) 10 MG tablet Take 1 tablet (10 mg total) by mouth daily as needed. 90 tablet 1   norethindrone  (AYGESTIN ) 5 MG tablet Take 1 tablet (5 mg total) by mouth daily. 90 tablet 1   olmesartan  (BENICAR ) 20 MG tablet Take 1 tablet by mouth once daily 90 tablet 0   simvastatin  (ZOCOR ) 10 MG tablet TAKE 1 TABLET BY MOUTH THREE TIMES A WEEK 36 tablet 0   SUMAtriptan  (IMITREX ) 25 MG tablet TAKE 1 TABLET BY MOUTH ONCE AS NEEDED FOR MIGRAINE FOR UP TO 1 DOSE MAY TAKE SECOND DOSE AFTER 2 HOURS IF NEEDED. 10 tablet 5   traZODone  (DESYREL ) 50 MG tablet Take 0.5-1 tablets (25-50 mg total) by mouth at bedtime as needed for sleep. 30 tablet 0   No current facility-administered medications for this visit.    Allergies  Allergen Reactions   Statins     Other reaction(s): Muscle Pain   Sulfa Antibiotics Rash    Family History  Problem Relation Age of Onset   COPD Mother    Heart failure Mother    Diabetes Mother    Hypertension Mother    Depression Mother    Hypertension Father    Hyperlipidemia Father    Healthy Sister    Cancer - Colon Neg Hx    Breast cancer Neg Hx    Ovarian cancer Neg Hx     Social History   Socioeconomic History   Marital status: Divorced    Spouse name: Not  on file   Number of children: Not on file   Years of education: Not on file   Highest education level: Associate degree: occupational, technical, or vocational program  Occupational History   Not on file  Tobacco Use   Smoking status: Every Day    Current packs/day: 0.75    Average packs/day: 0.8 packs/day for 20.0 years (15.0 ttl pk-yrs)    Types: Cigarettes   Smokeless tobacco: Never  Vaping Use   Vaping status: Never Used  Substance and Sexual Activity   Alcohol use: Never   Drug use: Never   Sexual activity: Not Currently  Other Topics Concern   Not on file  Social History Narrative   Not on file   Social Drivers of Health   Financial Resource Strain: Medium Risk (08/27/2023)   Overall  Financial Resource Strain (CARDIA)    Difficulty of Paying Living Expenses: Somewhat hard  Food Insecurity: No Food Insecurity (08/27/2023)   Hunger Vital Sign    Worried About Running Out of Food in the Last Year: Never true    Ran Out of Food in the Last Year: Never true  Transportation Needs: No Transportation Needs (08/27/2023)   PRAPARE - Administrator, Civil Service (Medical): No    Lack of Transportation (Non-Medical): No  Physical Activity: Insufficiently Active (08/27/2023)   Exercise Vital Sign    Days of Exercise per Week: 3 days    Minutes of Exercise per Session: 30 min  Stress: No Stress Concern Present (08/27/2023)   [employer] of Occupational Health - Occupational Stress Questionnaire    Feeling of Stress : Only a little  Social Connections: Moderately Isolated (08/27/2023)   Social Connection and Isolation Panel [NHANES]    Frequency of Communication with Friends and Family: More than three times a week    Frequency of Social Gatherings with Friends and Family: Twice a week    Attends Religious Services: More than 4 times per year    Active Member of [employer] or Organizations: No    Attends Engineer, structural: Not on file    Marital Status: Divorced  Intimate Partner Violence: Not on file     Constitutional: Denies fever, malaise, fatigue, headache or abrupt weight changes.  Respiratory: Denies difficulty breathing, shortness of breath, cough or sputum production.   Cardiovascular: Denies chest pain, chest tightness, palpitations or swelling in the hands or feet.  Musculoskeletal: Pt reports upper back pain. Denies decrease in range of motion, difficulty with gait, or joint swelling.  Skin: Denies redness, rashes, lesions or ulcercations.  Neurological: Patient reports paresthesia of right upper extremity.  Denies dizziness, difficulty with memory, difficulty with speech or problems with balance and coordination.   No other specific  complaints in a complete review of systems (except as listed in HPI above).  Observations/Objective:  Wt Readings from Last 3 Encounters:  11/06/23 200 lb 12.8 oz (91.1 kg)  10/02/23 203 lb 6.4 oz (92.3 kg)  08/27/23 200 lb (90.7 kg)    General: Appears her stated age, well developed, well nourished in NAD. Pulmonary/Chest: Normal effort . Musculoskeletal: Normal flexion, extension and rotation of the cervical spine.  Normal internal and external rotation of the left shoulder. Neurological: Alert and oriented. Coordination normal of the right hand.    BMET    Component Value Date/Time   NA 140 11/06/2023 1549   NA 142 04/26/2012 0123   K 3.8 11/06/2023 1549   K 3.3 (L) 04/26/2012 0123  CL 103 11/06/2023 1549   CL 109 (H) 04/26/2012 0123   CO2 27 11/06/2023 1549   CO2 24 04/26/2012 0123   GLUCOSE 97 11/06/2023 1549   GLUCOSE 100 (H) 04/26/2012 0123   BUN 12 11/06/2023 1549   BUN 7 04/26/2012 0123   CREATININE 0.96 11/06/2023 1549   CALCIUM  9.2 11/06/2023 1549   CALCIUM  9.3 04/26/2012 0123   GFRNONAA >60 04/26/2012 0123   GFRAA >60 04/26/2012 0123    Lipid Panel     Component Value Date/Time   CHOL 174 11/06/2023 1549   TRIG 188 (H) 11/06/2023 1549   HDL 27 (L) 11/06/2023 1549   CHOLHDL 6.4 (H) 11/06/2023 1549   LDLCALC 117 (H) 11/06/2023 1549    CBC    Component Value Date/Time   WBC 9.0 11/06/2023 1549   RBC 4.99 11/06/2023 1549   HGB 15.1 11/06/2023 1549   HGB 10.9 (L) 08/23/2012 2248   HCT 44.8 11/06/2023 1549   HCT 31.8 (L) 08/25/2012 0715   PLT 199 11/06/2023 1549   PLT 148 (L) 08/23/2012 2248   MCV 89.8 11/06/2023 1549   MCV 88 08/23/2012 2248   MCH 30.3 11/06/2023 1549   MCHC 33.7 11/06/2023 1549   RDW 12.1 11/06/2023 1549   RDW 13.2 08/23/2012 2248   LYMPHSABS 1,788 08/23/2022 0840   LYMPHSABS 3.0 08/23/2012 2248   MONOABS 0.7 08/23/2012 2248   EOSABS 61 08/23/2022 0840   EOSABS 0.1 08/23/2012 2248   BASOSABS 41 08/23/2022 0840   BASOSABS  0.0 08/23/2012 2248    Hgb A1C Lab Results  Component Value Date   HGBA1C 5.6 11/06/2023       Assessment and Plan:  Assessment and Plan    ER follow-up for paresthesia of right upper extremity, upper back pain: Chronic with worsening right arm symptoms. Previous evaluation indicated a pinched nerve. Awaiting spine center evaluation. Current treatment includes gabapentin. -ER notes reviewed - Continue gabapentin. Adjust dosage to 100 mg in the morning, 100 mg around lunch, and 300 mg at bedtime if needed for sleep. - Advise taking up to 800 mg of ibuprofen every eight hours in addition to gabapentin. - Follow up with the spine center for further evaluation and management. - Unable to review any imaging of the cervical spine however will likely need MRI - Consider physical therapy as well       RTC in 4 months for your annual exam  Follow Up Instructions:    I discussed the assessment and treatment plan with the patient. The patient was provided an opportunity to ask questions and all were answered. The patient agreed with the plan and demonstrated an understanding of the instructions.   The patient was advised to call back or seek an in-person evaluation if the symptoms worsen or if the condition fails to improve as anticipated.   Helayne Lo, NP

## 2023-12-25 NOTE — Patient Instructions (Signed)
 Cervical Radiculopathy  Cervical radiculopathy means that a nerve in the neck (a cervical nerve) is pinched or bruised. This can happen because of an injury to the cervical spine (vertebrae) in the neck, or as a normal part of getting older. This condition can cause pain or loss of feeling (numbness) that runs from your neck all the way down to your arm and fingers. Often, this condition gets better with rest. Treatment may be needed if the condition does not get better. What are the causes? A neck injury. A bulging disk in your spine. Sudden muscle tightening (muscle spasms). Tight muscles in your neck due to overuse. Arthritis. Breakdown in the bones and joints of the spine (spondylosis) due to getting older. Bone spurs that form near the nerves in the neck. What are the signs or symptoms? Pain. The pain may: Run from the neck to the arm and hand. Be very bad or irritating. Get worse when you move your neck. Loss of feeling or tingling in your arm or hand. Weakness in your arm or hand, in very bad cases. How is this treated? In many cases, treatment is not needed for this condition. With rest, the condition often gets better over time. If treatment is needed, options may include: Wearing a soft neck collar (cervical collar) for short periods of time. Doing exercises (physical therapy) to strengthen your neck muscles. Taking medicines. Having shots (injections) in your spine, in very bad cases. Having surgery. This may be needed if other treatments do not help. The type of surgery that is used will depend on the cause of your condition. Follow these instructions at home: If you have a soft neck collar: Wear it as told by your doctor. Take it off only as told by your doctor. Ask your doctor if you can take the collar off for cleaning and bathing. If you are allowed to take the collar off for cleaning or bathing: Follow instructions from your doctor about how to take off the collar  safely. Clean the collar by wiping it with mild soap and water and drying it completely. Take out any removable pads in the collar every 1-2 days. Wash them by hand with soap and water. Let them air-dry completely before you put them back in the collar. Check your skin under the collar for redness or sores. If you see any, tell your doctor. Managing pain     Take over-the-counter and prescription medicines only as told by your doctor. If told, put ice on the painful area. To do this: If you have a soft neck collar, take if off as told by your doctor. Put ice in a plastic bag. Place a towel between your skin and the bag. Leave the ice on for 20 minutes, 2-3 times a day. Take off the ice if your skin turns bright red. This is very important. If you cannot feel pain, heat, or cold, you have a greater risk of damage to the area. If using ice does not help, you can try using heat. Use the heat source that your doctor recommends, such as a moist heat pack or a heating pad. Place a towel between your skin and the heat source. Leave the heat on for 20-30 minutes. Take off the heat if your skin turns bright red. This is very important. If you cannot feel pain, heat, or cold, you have a greater risk of getting burned. You may try a gentle neck and shoulder rub (massage). Activity Rest as needed. Return  to your normal activities when your doctor says that it is safe. Do exercises as told by your doctor or physical therapist. You may have to avoid lifting. Ask your doctor how much you can safely lift. General instructions Use a flat pillow when you sleep. Do not drive while wearing a soft neck collar. If you do not have a soft neck collar, ask your doctor if it is safe to drive while your neck heals. Ask your doctor if you should avoid driving or using machines while you are taking your medicine. Do not smoke or use any products that contain nicotine or tobacco. If you need help quitting, ask your  doctor. Keep all follow-up visits. Contact a doctor if: Your condition does not get better with treatment. Get help right away if: Your pain gets worse and medicine does not help. You lose feeling or feel weak in your hand, arm, face, or leg. You have a high fever. Your neck is stiff. You cannot control when you poop or pee (have incontinence). You have trouble with walking, balance, or talking. Summary Cervical radiculopathy means that a nerve in the neck is pinched or bruised. A nerve can get pinched from a bulging disk, arthritis, an injury to the neck, or other causes. Symptoms include pain, tingling, or loss of feeling that goes from the neck to the arm or hand. Weakness in your arm or hand can happen in very bad cases. Treatment may include resting, wearing a soft neck collar, and doing exercises. You might need to take medicines for pain. In very bad cases, shots or surgery may be needed. This information is not intended to replace advice given to you by your health care provider. Make sure you discuss any questions you have with your health care provider. Document Revised: 02/24/2021 Document Reviewed: 02/24/2021 Elsevier Patient Education  2024 [employer].

## 2024-01-01 ENCOUNTER — Other Ambulatory Visit: Payer: Self-pay | Admitting: Internal Medicine

## 2024-01-04 NOTE — Telephone Encounter (Signed)
 Requested Prescriptions  Pending Prescriptions Disp Refills   norethindrone  (AYGESTIN ) 5 MG tablet [Pharmacy Med Name: Norethindrone  Acetate 5 MG Oral Tablet] 90 tablet 1    Sig: Take 1 tablet by mouth once daily     OB/GYN: Contraceptives - Progestins Failed - 01/04/2024 12:42 PM      Failed - Patient is not a smoker      Passed - Last BP in normal range    BP Readings from Last 1 Encounters:  11/06/23 132/82         Passed - Valid encounter within last 12 months    Recent Outpatient Visits           1 week ago Paresthesia of right upper extremity   [facility] [place], Rankin Buzzard, NP   1 month ago Pure hypercholesterolemia   [facility], Rankin Buzzard, [place]

## 2024-01-07 ENCOUNTER — Encounter: Payer: Self-pay | Admitting: Internal Medicine

## 2024-01-08 NOTE — Telephone Encounter (Signed)
 Please have her set up a follow-up appointment, not on Friday so that we can obtain x-rays possibly of the neck and elbow

## 2024-01-17 ENCOUNTER — Ambulatory Visit
Admission: EM | Admit: 2024-01-17 | Discharge: 2024-01-17 | Disposition: A | Discharge status: Home / Self Care | Source: Ambulatory Visit | Attending: Emergency Medicine | Admitting: Emergency Medicine

## 2024-01-17 ENCOUNTER — Emergency Department

## 2024-01-17 ENCOUNTER — Emergency Department
Admission: EM | Admit: 2024-01-17 | Discharge: 2024-01-17 | Disposition: A | Discharge status: Home / Self Care | Attending: Emergency Medicine | Admitting: Emergency Medicine

## 2024-01-17 ENCOUNTER — Encounter: Payer: Self-pay | Admitting: Emergency Medicine

## 2024-01-17 ENCOUNTER — Other Ambulatory Visit: Payer: Self-pay

## 2024-01-17 DIAGNOSIS — S61511A Laceration without foreign body of right wrist, initial encounter: Secondary | ICD-10-CM | POA: Insufficient documentation

## 2024-01-17 DIAGNOSIS — S0101XA Laceration without foreign body of scalp, initial encounter: Secondary | ICD-10-CM | POA: Insufficient documentation

## 2024-01-17 DIAGNOSIS — T7421XA Adult sexual abuse, confirmed, initial encounter: Secondary | ICD-10-CM | POA: Diagnosis present

## 2024-01-17 DIAGNOSIS — S0083XA Contusion of other part of head, initial encounter: Secondary | ICD-10-CM

## 2024-01-17 DIAGNOSIS — S61011A Laceration without foreign body of right thumb without damage to nail, initial encounter: Secondary | ICD-10-CM | POA: Insufficient documentation

## 2024-01-17 DIAGNOSIS — S01112A Laceration without foreign body of left eyelid and periocular area, initial encounter: Secondary | ICD-10-CM | POA: Diagnosis not present

## 2024-01-17 DIAGNOSIS — S61412A Laceration without foreign body of left hand, initial encounter: Secondary | ICD-10-CM

## 2024-01-17 DIAGNOSIS — S51812A Laceration without foreign body of left forearm, initial encounter: Secondary | ICD-10-CM | POA: Insufficient documentation

## 2024-01-17 DIAGNOSIS — Z0441 Encounter for examination and observation following alleged adult rape: Secondary | ICD-10-CM | POA: Insufficient documentation

## 2024-01-17 DIAGNOSIS — S0181XA Laceration without foreign body of other part of head, initial encounter: Secondary | ICD-10-CM

## 2024-01-17 LAB — URINALYSIS, COMPLETE (UACMP) WITH MICROSCOPIC
Bacteria, UA: NONE SEEN
Bilirubin Urine: NEGATIVE
Glucose, UA: NEGATIVE mg/dL
Ketones, ur: NEGATIVE mg/dL
Leukocytes,Ua: NEGATIVE
Nitrite: NEGATIVE
Protein, ur: NEGATIVE mg/dL
Specific Gravity, Urine: 1.005 (ref 1.005–1.030)
pH: 5 (ref 5.0–8.0)

## 2024-01-17 LAB — URINE DRUG SCREEN, QUALITATIVE (ARMC ONLY)
Amphetamines, Ur Screen: NOT DETECTED
Barbiturates, Ur Screen: NOT DETECTED
Benzodiazepine, Ur Scrn: NOT DETECTED
Cannabinoid 50 Ng, Ur ~~LOC~~: NOT DETECTED
Cocaine Metabolite,Ur ~~LOC~~: NOT DETECTED
MDMA (Ecstasy)Ur Screen: NOT DETECTED
Methadone Scn, Ur: NOT DETECTED
Opiate, Ur Screen: NOT DETECTED
Phencyclidine (PCP) Ur S: NOT DETECTED
Tricyclic, Ur Screen: NOT DETECTED

## 2024-01-17 LAB — POCT PREGNANCY, URINE: Preg Test, Ur: NEGATIVE

## 2024-01-17 LAB — RAPID HIV SCREEN (HIV 1/2 AB+AG)
HIV 1/2 Antibodies: NONREACTIVE
HIV-1 P24 Antigen - HIV24: NONREACTIVE

## 2024-01-17 LAB — HCG, QUANTITATIVE, PREGNANCY: hCG, Beta Chain, Quant, S: <1 m[IU]/mL (ref <5)

## 2024-01-17 LAB — CHLAMYDIA/NGC RT PCR (ARMC ONLY)
Chlamydia Tr: NOT DETECTED
N gonorrhoeae: NOT DETECTED

## 2024-01-17 MED ORDER — CEFTRIAXONE SODIUM 1 G IJ SOLR
500.0000 mg | Freq: Once | INTRAMUSCULAR | Status: AC
  Administered 2024-01-17: 500 mg via INTRAMUSCULAR
  Filled 2024-01-17: qty 10

## 2024-01-17 MED ORDER — LIDOCAINE HCL (PF) 1 % IJ SOLN
1.0000 mL | Freq: Once | INTRAMUSCULAR | Status: AC
  Administered 2024-01-17: 2.1 mL
  Filled 2024-01-17: qty 5

## 2024-01-17 MED ORDER — AZITHROMYCIN 500 MG PO TABS
1000.0000 mg | ORAL_TABLET | Freq: Once | ORAL | Status: AC
  Administered 2024-01-17: 1000 mg via ORAL
  Filled 2024-01-17: qty 2

## 2024-01-17 MED ORDER — IBUPROFEN 600 MG PO TABS
600.0000 mg | ORAL_TABLET | Freq: Once | ORAL | Status: AC
  Administered 2024-01-17: 600 mg via ORAL
  Filled 2024-01-17: qty 1

## 2024-01-17 MED ORDER — ACETAMINOPHEN 500 MG PO TABS
1000.0000 mg | ORAL_TABLET | Freq: Once | ORAL | Status: AC
Start: 2024-01-17 — End: 2024-01-17
  Administered 2024-01-17: 1000 mg via ORAL
  Filled 2024-01-17: qty 2

## 2024-01-17 MED ORDER — LIDOCAINE HCL (PF) 1 % IJ SOLN
30.0000 mL | Freq: Once | INTRAMUSCULAR | Status: DC
  Filled 2024-01-17: qty 30

## 2024-01-17 MED ORDER — METRONIDAZOLE 500 MG PO TABS
2000.0000 mg | ORAL_TABLET | Freq: Once | ORAL | Status: AC
  Administered 2024-01-17: 2000 mg via ORAL
  Filled 2024-01-17: qty 4

## 2024-01-17 MED ORDER — LIDOCAINE-EPINEPHRINE 1 %-1:100000 IJ SOLN
20.0000 mL | Freq: Once | INTRAMUSCULAR | Status: AC
  Administered 2024-01-17: 20 mL
  Filled 2024-01-17: qty 20

## 2024-01-17 NOTE — ED Notes (Signed)
 Pt to CT

## 2024-01-17 NOTE — ED Notes (Signed)
 SANE RN, J.SMITH IN ROUTE TO ED WITHIN THE HOUR.

## 2024-01-17 NOTE — ED Notes (Signed)
 Pt was unable to hold urine, dirty catch obtained, bathroom tissue collected and placed in biohazard bag.

## 2024-01-17 NOTE — ED Notes (Signed)
SANE RN at bedside.

## 2024-01-17 NOTE — ED Notes (Signed)
Provider at bedside for wound care.
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
## 2024-01-17 NOTE — ED Notes (Addendum)
Poct pregnancy negative ?
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
## 2024-01-17 NOTE — ED Notes (Signed)
Suture cart to bedside. 

## 2024-01-17 NOTE — ED Notes (Signed)
 Pt provided d/c papers as well as resources to [facility] for pt assistance. Pt tearful at discharge however pt sts that she is fine to go home with [employer].
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
## 2024-01-17 NOTE — Discharge Instructions (Addendum)
 See your doctor return to the emergency department in about 10 days for staple removal from your scalp and suture removal from your hand.  The skin glue that fix the small wound on the left side of your face will dissolve with time.  Please keep your wound clean by washing at least daily with soap and water.  Apply antibiotic ointment and a bandage if possible. If you see any signs of infection like spreading redness, pus coming from the wound, extreme pain, fevers, chills or any other worsening doctor right away or come back to the emergency department.  Your upper tooth is loose and you will need to see a dentist for the tooth injury. If you don't have a dentist I have provided you with a list of local dentists that you can call for an appointment.  Take acetaminophen  650 mg and ibuprofen 400 mg every 6 hours for pain.  Take with food.  Use ice to help with pain and swelling.  Thank you for choosing us  for your health care today!  Please see your primary doctor this week for a follow up appointment.   If you have any new, worsening, or unexpected symptoms call your doctor right away or come back to the emergency department for reevaluation.  It was my pleasure to care for you today.   Arron Large Margery Sheets, MD

## 2024-01-18 ENCOUNTER — Encounter: Payer: Self-pay | Admitting: Internal Medicine

## 2024-01-22 ENCOUNTER — Ambulatory Visit (INDEPENDENT_AMBULATORY_CARE_PROVIDER_SITE_OTHER): Admitting: Internal Medicine

## 2024-01-22 ENCOUNTER — Encounter: Payer: Self-pay | Admitting: Internal Medicine

## 2024-01-22 VITALS — BP 138/88 | Ht 67.0 in | Wt 199.6 lb

## 2024-01-22 DIAGNOSIS — S0083XD Contusion of other part of head, subsequent encounter: Secondary | ICD-10-CM

## 2024-01-22 DIAGNOSIS — S51811D Laceration without foreign body of right forearm, subsequent encounter: Secondary | ICD-10-CM

## 2024-01-22 DIAGNOSIS — S0181XD Laceration without foreign body of other part of head, subsequent encounter: Secondary | ICD-10-CM

## 2024-01-22 DIAGNOSIS — T7421XD Adult sexual abuse, confirmed, subsequent encounter: Secondary | ICD-10-CM | POA: Diagnosis not present

## 2024-01-22 DIAGNOSIS — S61412D Laceration without foreign body of left hand, subsequent encounter: Secondary | ICD-10-CM | POA: Diagnosis not present

## 2024-01-22 DIAGNOSIS — S0101XD Laceration without foreign body of scalp, subsequent encounter: Secondary | ICD-10-CM

## 2024-01-22 DIAGNOSIS — S61011D Laceration without foreign body of right thumb without damage to nail, subsequent encounter: Secondary | ICD-10-CM
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
## 2024-01-27 ENCOUNTER — Emergency Department
Admission: EM | Admit: 2024-01-27 | Discharge: 2024-01-27 | Disposition: A | Discharge status: Home / Self Care | Attending: Emergency Medicine | Admitting: Emergency Medicine

## 2024-01-27 ENCOUNTER — Other Ambulatory Visit: Payer: Self-pay

## 2024-01-27 DIAGNOSIS — S61011D Laceration without foreign body of right thumb without damage to nail, subsequent encounter: Secondary | ICD-10-CM | POA: Insufficient documentation

## 2024-01-27 DIAGNOSIS — Z4802 Encounter for removal of sutures: Secondary | ICD-10-CM | POA: Insufficient documentation

## 2024-01-27 DIAGNOSIS — X58XXXD Exposure to other specified factors, subsequent encounter: Secondary | ICD-10-CM | POA: Insufficient documentation

## 2024-01-27 HISTORY — DX: Essential (primary) hypertension: I10

## 2024-01-27 HISTORY — DX: Pure hypercholesterolemia, unspecified: E78.00

## 2024-01-27 NOTE — ED Triage Notes (Signed)
 Pt to ED for suture removal. Both hands and head. No complaints.

## 2024-01-27 NOTE — Discharge Instructions (Addendum)
 You have been seen in the Emergency Department (ED) today for suture and staple removal.  Please keep the areas clean and dry. You may shower. Pat the areas dry after shower.  Please take Tylenol  (acetaminophen ) or Motrin  (ibuprofen ) as needed for discomfort as written on the box.   Return to the ED or call your doctor if you notice any signs of infection such as fever, increased pain, increased redness, pus, or other symptoms that concern you.

## 2024-01-27 NOTE — ED Provider Notes (Signed)
 [facility] Provider Note    Event Date/Time   First MD Initiated Contact with Patient 01/27/24 1308     (approximate)   History   Suture / Staple Removal   HPI Crystal Freeman is a 48 y.o. female presenting to the emergency department for suture and staple removal.  She has sutures present on her right wrist and right thumb, left lower arm, and staples in her right parietal region.  Denies fever, puslike drainage, erythema, edema.       Physical Exam   Triage Vital Signs: ED Triage Vitals  Encounter Vitals Group     BP 01/27/24 1236 105/78     Systolic BP Percentile --      Diastolic BP Percentile --      Pulse Rate 01/27/24 1236 85     Resp 01/27/24 1236 20     Temp 01/27/24 1236 97.9 F (36.6 C)     Temp Source 01/27/24 1236 Oral     SpO2 01/27/24 1236 100 %     Weight 01/27/24 1234 198 lb (89.8 kg)     Height 01/27/24 1234 5\' 7"  (1.702 m)     Head Circumference --      Peak Flow --      Pain Score 01/27/24 1233 0     Pain Loc --      Pain Education --      Exclude from Growth Chart --     Most recent vital signs: Vitals:   01/27/24 1236  BP: 105/78  Pulse: 85  Resp: 20  Temp: 97.9 F (36.6 C)  SpO2: 100%    General: Awake, no distress.  CV:  Good peripheral perfusion.  Resp:  Normal effort.  Abd:  No distention.  Other:  Clean nondraining healing lacerations noted to right wrist, right thumb, and left lower arm.  Patient has full range of motion to these areas. 4 staples present and healing laceration to right parietal region.    ED Results / Procedures / Treatments   Labs (all labs ordered are listed, but only abnormal results are displayed) Labs Reviewed - No data to display   EKG     RADIOLOGY   PROCEDURES:  Critical Care performed: No  Suture Removal  Date/Time: 01/27/2024 2:06 PM  Performed by: Thomasenia Flesher, PA-C Authorized by: Thomasenia Flesher, PA-C   Consent:    Consent obtained:  Verbal    Consent given by:  Patient   Risks, benefits, and alternatives were discussed: yes     Risks discussed:  Bleeding, pain and wound separation   Alternatives discussed:  No treatment Universal protocol:    Procedure explained and questions answered to patient or proxy's satisfaction: yes     Immediately prior to procedure, a time out was called: yes     Patient identity confirmed:  Verbally with patient Location:    Location: right wrist. Procedure details:    Wound appearance:  No signs of infection, good wound healing and tender   Number of sutures removed:  1 Post-procedure details:    Post-removal:  No dressing applied   Procedure completion:  Tolerated well, no immediate complications Suture Removal  Date/Time: 01/27/2024 2:13 PM  Performed by: Thomasenia Flesher, PA-C Authorized by: Thomasenia Flesher, PA-C   Consent:    Consent obtained:  Verbal   Consent given by:  Patient   Risks, benefits, and alternatives were discussed: yes     Risks discussed:  Bleeding, pain and wound separation  Alternatives discussed:  No treatment Universal protocol:    Patient identity confirmed:  Verbally with patient Location:    Location: left lower arm. Procedure details:    Wound appearance:  No signs of infection, good wound healing and clean   Number of sutures removed:  4 Post-procedure details:    Post-removal:  No dressing applied   Procedure completion:  Tolerated well, no immediate complications Suture Removal  Date/Time: 01/27/2024 2:14 PM  Performed by: Thomasenia Flesher, PA-C Authorized by: Thomasenia Flesher, PA-C   Consent:    Consent obtained:  Verbal   Consent given by:  Patient   Risks, benefits, and alternatives were discussed: yes     Risks discussed:  Bleeding, pain and wound separation   Alternatives discussed:  No treatment Universal protocol:    Procedure explained and questions answered to patient or proxy's satisfaction: yes     Immediately prior to procedure, a time out  was called: yes     Patient identity confirmed:  Verbally with patient Location:    Location: right thumb. Procedure details:    Wound appearance:  No signs of infection, good wound healing and clean   Number of sutures removed:  3 Post-procedure details:    Post-removal:  No dressing applied   Procedure completion:  Tolerated well, no immediate complications Suture Removal  Date/Time: 01/27/2024 2:15 PM  Performed by: Thomasenia Flesher, PA-C Authorized by: Thomasenia Flesher, PA-C   Consent:    Consent obtained:  Verbal   Consent given by:  Patient   Risks, benefits, and alternatives were discussed: yes     Risks discussed:  Bleeding, pain and wound separation Universal protocol:    Procedure explained and questions answered to patient or proxy's satisfaction: yes     Immediately prior to procedure, a time out was called: yes     Patient identity confirmed:  Verbally with patient Location:    Location: right parietal region. Procedure details:    Wound appearance:  No signs of infection, good wound healing and clean   Number of staples removed:  4 Post-procedure details:    Post-removal:  No dressing applied   Procedure completion:  Tolerated    MEDICATIONS ORDERED IN ED: Medications - No data to display   IMPRESSION / MDM / ASSESSMENT AND PLAN / ED COURSE  I reviewed the triage vital signs and the nursing notes.                              Differential diagnosis includes, but is not limited to, suture removal for laceration, staple removal for laceration, cellulitis, abscess  Patient's presentation is most consistent with acute, uncomplicated illness.  Patient presented today for removal of her sutures and staples following an ER visit on 5/15.  Please see procedure note above for full details regarding removal.  There were no immediate complications and she tolerated these procedures well.  Emergency department return precautions were discussed with the patient.   Patient is in agreement to the treatment plan.  Patient is stable for discharge.      FINAL CLINICAL IMPRESSION(S) / ED DIAGNOSES   Final diagnoses:  Visit for suture removal  Encounter for staple removal     Rx / DC Orders   ED Discharge Orders     None        Note:  This document was prepared using Dragon voice recognition software and may include unintentional dictation errors.  Thomasenia Flesher, PA-C 01/27/24 1543    Lubertha Rush, MD 01/27/24 [phone]

## 2024-01-30 ENCOUNTER — Encounter: Payer: Self-pay | Admitting: Internal Medicine

## 2024-01-31 ENCOUNTER — Encounter: Payer: Self-pay | Admitting: Internal Medicine

## 2024-01-31 NOTE — Telephone Encounter (Signed)
 Okay for work note stating to be on intermittent leave until June 12.

## 2024-02-01 ENCOUNTER — Emergency Department
Admission: EM | Admit: 2024-02-01 | Discharge: 2024-02-01 | Disposition: A | Discharge status: Home / Self Care | Attending: Emergency Medicine | Admitting: Emergency Medicine

## 2024-02-01 ENCOUNTER — Encounter: Payer: Self-pay | Admitting: Emergency Medicine

## 2024-02-01 ENCOUNTER — Emergency Department

## 2024-02-01 ENCOUNTER — Other Ambulatory Visit: Payer: Self-pay

## 2024-02-01 DIAGNOSIS — I10 Essential (primary) hypertension: Secondary | ICD-10-CM | POA: Insufficient documentation

## 2024-02-01 DIAGNOSIS — R519 Headache, unspecified: Secondary | ICD-10-CM | POA: Insufficient documentation

## 2024-02-01 MED ORDER — DIPHENHYDRAMINE HCL 50 MG/ML IJ SOLN
12.5000 mg | Freq: Once | INTRAMUSCULAR | Status: AC
  Administered 2024-02-01: 12.5 mg via INTRAVENOUS
  Filled 2024-02-01: qty 1

## 2024-02-01 MED ORDER — KETOROLAC TROMETHAMINE 30 MG/ML IJ SOLN: 15.0000 mg | Freq: Once | INTRAMUSCULAR | Status: DC

## 2024-02-01 MED ORDER — KETOROLAC TROMETHAMINE 10 MG PO TABS: 10.0000 mg | ORAL_TABLET | Freq: Four times a day (QID) | ORAL | 0 refills | Status: DC | PRN

## 2024-02-01 MED ORDER — PROCHLORPERAZINE EDISYLATE 10 MG/2ML IJ SOLN
10.0000 mg | Freq: Once | INTRAMUSCULAR | Status: AC
  Administered 2024-02-01: 10 mg via INTRAVENOUS
  Filled 2024-02-01: qty 2

## 2024-02-01 MED ORDER — KETOROLAC TROMETHAMINE 30 MG/ML IJ SOLN
30.0000 mg | Freq: Once | INTRAMUSCULAR | Status: AC
  Administered 2024-02-01: 30 mg via INTRAVENOUS
  Filled 2024-02-01: qty 1

## 2024-02-01 MED ORDER — ONDANSETRON HCL 4 MG/2ML IJ SOLN
4.0000 mg | Freq: Once | INTRAMUSCULAR | Status: AC
  Administered 2024-02-01: 4 mg via INTRAVENOUS
  Filled 2024-02-01: qty 2

## 2024-02-01 MED ORDER — PROCHLORPERAZINE MALEATE 5 MG PO TABS: 5.0000 mg | ORAL_TABLET | Freq: Three times a day (TID) | ORAL | 0 refills | Status: DC | PRN
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
## 2024-02-01 NOTE — Discharge Instructions (Signed)
 Follow-up with your primary care provider if you are not improving over the next few days.  Take the medication as prescribed.  Be aware that the Compazine may cause you to feel drowsy or dizzy.  Do not drive or perform any activities that would cause imbalance for at least 8 hours after taking the last dose.  If your symptoms change or worsen, please return to the emergency department.

## 2024-05-07 ENCOUNTER — Encounter: Payer: Self-pay | Admitting: Internal Medicine

## 2024-05-07 ENCOUNTER — Ambulatory Visit (INDEPENDENT_AMBULATORY_CARE_PROVIDER_SITE_OTHER): Admitting: Internal Medicine

## 2024-05-07 VITALS — BP 124/78 | Ht 67.0 in | Wt 196.2 lb

## 2024-05-07 DIAGNOSIS — Z0001 Encounter for general adult medical examination with abnormal findings: Secondary | ICD-10-CM

## 2024-05-07 DIAGNOSIS — E6609 Other obesity due to excess calories: Secondary | ICD-10-CM

## 2024-05-07 DIAGNOSIS — Z1211 Encounter for screening for malignant neoplasm of colon: Secondary | ICD-10-CM

## 2024-05-07 DIAGNOSIS — Z23 Encounter for immunization: Secondary | ICD-10-CM | POA: Diagnosis not present

## 2024-05-07 DIAGNOSIS — Z1231 Encounter for screening mammogram for malignant neoplasm of breast: Secondary | ICD-10-CM

## 2024-05-07 DIAGNOSIS — E66811 Obesity, class 1: Secondary | ICD-10-CM

## 2024-05-07 DIAGNOSIS — R739 Hyperglycemia, unspecified: Secondary | ICD-10-CM

## 2024-05-07 DIAGNOSIS — Z6830 Body mass index (BMI) 30.0-30.9, adult: Secondary | ICD-10-CM

## 2024-05-07 DIAGNOSIS — E7800 Pure hypercholesterolemia, unspecified: Secondary | ICD-10-CM | POA: Diagnosis not present

## 2024-05-07 MED ORDER — OLMESARTAN MEDOXOMIL 20 MG PO TABS: 20.0000 mg | ORAL_TABLET | Freq: Every day | ORAL | 1 refills | Status: AC

## 2024-05-07 MED ORDER — BUSPIRONE HCL 10 MG PO TABS: 10.0000 mg | ORAL_TABLET | Freq: Every day | ORAL | 1 refills | Status: AC | PRN

## 2024-05-07 MED ORDER — SUMATRIPTAN SUCCINATE 25 MG PO TABS: ORAL_TABLET | ORAL | 5 refills | Status: AC

## 2024-05-07 NOTE — Progress Notes (Signed)
 Subjective:    Patient ID: Crystal Freeman, female    DOB: Jan 27, 1976, 48 y.o.   MRN: [id number]  HPI  Patient presents to clinic today for her annual exam.  Flu: 06/2022 Tetanus: 06/2017 COVID: X 1 Pneumovax: 07/2017 Pap smear: 03/2023, Dr Connell Mammogram: 04/2022 Colon screening: never Vision screening: as needed Dentist: biannually  Diet: She does eat meat. She consumes fruits and veggies. She does eat some fried foods. She drinks mostly sweet tea, coffee, soda Exercise: None  Review of Systems     Past Medical History:  Diagnosis Date   Hypercholesteremia    Hypertension    Migraine     Current Outpatient Medications  Medication Sig Dispense Refill   acetaminophen  (TYLENOL ) 500 MG tablet Take by mouth.     busPIRone  (BUSPAR ) 10 MG tablet Take 1 tablet (10 mg total) by mouth daily as needed. 90 tablet 1   ketorolac  (TORADOL ) 10 MG tablet Take 1 tablet (10 mg total) by mouth every 6 (six) hours as needed. 20 tablet 0   norethindrone  (AYGESTIN ) 5 MG tablet Take 1 tablet by mouth once daily 90 tablet 1   olmesartan  (BENICAR ) 20 MG tablet Take 1 tablet by mouth once daily 90 tablet 0   prochlorperazine  (COMPAZINE ) 5 MG tablet Take 1 tablet (5 mg total) by mouth every 8 (eight) hours as needed for nausea or vomiting. 30 tablet 0   simvastatin  (ZOCOR ) 10 MG tablet TAKE 1 TABLET BY MOUTH THREE TIMES A WEEK 36 tablet 0   SUMAtriptan  (IMITREX ) 25 MG tablet TAKE 1 TABLET BY MOUTH ONCE AS NEEDED FOR MIGRAINE FOR UP TO 1 DOSE MAY TAKE SECOND DOSE AFTER 2 HOURS IF NEEDED. 10 tablet 5   traZODone  (DESYREL ) 50 MG tablet Take 0.5-1 tablets (25-50 mg total) by mouth at bedtime as needed for sleep. 30 tablet 0   No current facility-administered medications for this visit.    Allergies  Allergen Reactions   Statins     Other reaction(s): Muscle Pain   Sulfa Antibiotics Rash    Family History  Problem Relation Age of Onset   COPD Mother    Heart failure Mother    Diabetes  Mother    Hypertension Mother    Depression Mother    Hypertension Father    Hyperlipidemia Father    Healthy Sister    Cancer - Colon Neg Hx    Breast cancer Neg Hx    Ovarian cancer Neg Hx     Social History   Socioeconomic History   Marital status: Divorced    Spouse name: Not on file   Number of children: Not on file   Years of education: Not on file   Highest education level: Associate degree: occupational, Scientist, product/process development, or vocational program  Occupational History   Not on file  Tobacco Use   Smoking status: Every Day    Current packs/day: 0.75    Average packs/day: 0.8 packs/day for 20.0 years (15.0 ttl pk-yrs)    Types: Cigarettes   Smokeless tobacco: Never  Vaping Use   Vaping status: Never Used  Substance and Sexual Activity   Alcohol use: Never   Drug use: Never   Sexual activity: Not Currently  Other Topics Concern   Not on file  Social History Narrative   Not on file   Social Drivers of Health   Financial Resource Strain: Low Risk  (05/06/2024)   Overall Financial Resource Strain (CARDIA)    Difficulty of Paying Living Expenses: Not  hard at all  Food Insecurity: No Food Insecurity (05/06/2024)   Hunger Vital Sign    Worried About Running Out of Food in the Last Year: Never true    Ran Out of Food in the Last Year: Never true  Transportation Needs: No Transportation Needs (05/06/2024)   PRAPARE - Administrator, Civil Service (Medical): No    Lack of Transportation (Non-Medical): No  Physical Activity: Insufficiently Active (05/06/2024)   Exercise Vital Sign    Days of Exercise per Week: 3 days    Minutes of Exercise per Session: 30 min  Stress: Stress Concern Present (05/06/2024)   [employer] of Occupational Health - Occupational Stress Questionnaire    Feeling of Stress: Rather much  Social Connections: Moderately Isolated (05/06/2024)   Social Connection and Isolation Panel    Frequency of Communication with Friends and Family: More than  three times a week    Frequency of Social Gatherings with Friends and Family: Once a week    Attends Religious Services: More than 4 times per year    Active Member of [employer] or Organizations: No    Attends Engineer, structural: Not on file    Marital Status: Divorced  Intimate Partner Violence: Not on file     Constitutional: Patient reports intermittent headaches.  Denies fever, malaise, fatigue, or abrupt weight changes.  HEENT: Denies eye pain, eye redness, ear pain, ringing in the ears, wax buildup, runny nose, nasal congestion, bloody nose, or sore throat. Respiratory: Denies difficulty breathing, shortness of breath, cough or sputum production.   Cardiovascular: Denies chest pain, chest tightness, palpitations or swelling in the hands or feet.  Gastrointestinal: Denies abdominal pain, bloating, constipation, diarrhea or blood in the stool.  GU: Pt reports irregular periods. Denies urgency, frequency, pain with urination, burning sensation, blood in urine, odor or discharge. Musculoskeletal: Denies decrease in range of motion, difficulty with gait, muscle pain or joint pain and swelling.  Skin: Denies redness, rashes, lesions or ulcercations.  Neurological: Pt reports insomnia. Denies dizziness, difficulty with memory, difficulty with speech or problems with balance and coordination.  Psych: Patient has a history of anxiety.  Denies depression, SI/HI.  No other specific complaints in a complete review of systems (except as listed in HPI above).  Objective:   Physical Exam  BP 124/78 (BP Location: Left Arm, Patient Position: Sitting, Cuff Size: Normal)   Ht 5' 7 (1.702 m)   Wt 196 lb 3.2 oz (89 kg)   LMP 04/23/2024 (Approximate)   BMI 30.73 kg/m    Wt Readings from Last 3 Encounters:  02/01/24 198 lb (89.8 kg)  01/27/24 198 lb (89.8 kg)  01/22/24 199 lb 9.6 oz (90.5 kg)    General: Appears her stated age,obese, in NAD. Skin: Warm, dry and intact. HEENT: Head:  normal shape and size; Eyes: sclera white, no icterus, conjunctiva pink, PERRLA and EOMs intact; Neck:  Neck supple, trachea midline. No masses, lumps or thyromegaly present.  Cardiovascular: Normal rate and rhythm. S1,S2 noted.  No murmur, rubs or gallops noted. No JVD or BLE edema.  Pulmonary/Chest: Normal effort and positive vesicular breath sounds. No respiratory distress. No wheezes, rales or ronchi noted.  Abdomen: Normal bowel sounds. Musculoskeletal: Strength 5/5 BUE/BLE.  No difficulty with gait.  Neurological: Alert and oriented. Cranial nerves II-XII grossly intact. Coordination normal.  Psychiatric: Mood and affect normal. Behavior is normal. Judgment and thought content normal.    BMET    Component Value Date/Time  NA 140 11/06/2023 1549   NA 142 04/26/2012 0123   K 3.8 11/06/2023 1549   K 3.3 (L) 04/26/2012 0123   CL 103 11/06/2023 1549   CL 109 (H) 04/26/2012 0123   CO2 27 11/06/2023 1549   CO2 24 04/26/2012 0123   GLUCOSE 97 11/06/2023 1549   GLUCOSE 100 (H) 04/26/2012 0123   BUN 12 11/06/2023 1549   BUN 7 04/26/2012 0123   CREATININE 0.96 11/06/2023 1549   CALCIUM  9.2 11/06/2023 1549   CALCIUM  9.3 04/26/2012 0123   GFRNONAA >60 04/26/2012 0123   GFRAA >60 04/26/2012 0123    Lipid Panel     Component Value Date/Time   CHOL 174 11/06/2023 1549   TRIG 188 (H) 11/06/2023 1549   HDL 27 (L) 11/06/2023 1549   CHOLHDL 6.4 (H) 11/06/2023 1549   LDLCALC 117 (H) 11/06/2023 1549    CBC    Component Value Date/Time   WBC 9.0 11/06/2023 1549   RBC 4.99 11/06/2023 1549   HGB 15.1 11/06/2023 1549   HGB 10.9 (L) 08/23/2012 2248   HCT 44.8 11/06/2023 1549   HCT 31.8 (L) 08/25/2012 0715   PLT 199 11/06/2023 1549   PLT 148 (L) 08/23/2012 2248   MCV 89.8 11/06/2023 1549   MCV 88 08/23/2012 2248   MCH 30.3 11/06/2023 1549   MCHC 33.7 11/06/2023 1549   RDW 12.1 11/06/2023 1549   RDW 13.2 08/23/2012 2248   LYMPHSABS 1,788 08/23/2022 0840   LYMPHSABS 3.0  08/23/2012 2248   MONOABS 0.7 08/23/2012 2248   EOSABS 61 08/23/2022 0840   EOSABS 0.1 08/23/2012 2248   BASOSABS 41 08/23/2022 0840   BASOSABS 0.0 08/23/2012 2248    Hgb A1C Lab Results  Component Value Date   HGBA1C 5.6 11/06/2023           Assessment & Plan:   Preventative health maintenance:  Flu shot today Tetanus UTD Encouraged her to get her COVID booster Pneumovax UTD, Prevnar 20 today Pap smear UTD, request copy Mammogram ordered-she will call to schedule Cologuard ordered Encouraged her to consume a balanced diet and exercise regimen Advised her to see an eye doctor and dentist annually We will check CBC, c-Met, lipid, A1c today  RTC in 6 months, follow-up chronic conditions Angeline Laura, NP

## 2024-05-07 NOTE — Assessment & Plan Note (Signed)
 Encourage diet and exercise for weight loss

## 2024-05-07 NOTE — Patient Instructions (Signed)

## 2024-05-08 ENCOUNTER — Ambulatory Visit: Payer: Self-pay | Admitting: Internal Medicine

## 2024-05-08 LAB — COMPREHENSIVE METABOLIC PANEL WITH GFR
AG Ratio: 1.9 (calc) (ref 1.0–2.5)
ALT: 13 U/L (ref 6–29)
AST: 12 U/L (ref 10–35)
Albumin: 4.4 g/dL (ref 3.6–5.1)
Alkaline phosphatase (APISO): 70 U/L (ref 31–125)
BUN: 11 mg/dL (ref 7–25)
CO2: 25 mmol/L (ref 20–32)
Calcium: 8.7 mg/dL (ref 8.6–10.2)
Chloride: 109 mmol/L (ref 98–110)
Creat: 0.95 mg/dL (ref 0.50–0.99)
Globulin: 2.3 g/dL (ref 1.9–3.7)
Glucose, Bld: 90 mg/dL (ref 65–99)
Potassium: 4.1 mmol/L (ref 3.5–5.3)
Sodium: 141 mmol/L (ref 135–146)
Total Bilirubin: 0.5 mg/dL (ref 0.2–1.2)
Total Protein: 6.7 g/dL (ref 6.1–8.1)
eGFR: 75 mL/min/1.73m2 (ref >=60)

## 2024-05-08 LAB — CBC
HCT: 48 % — ABNORMAL HIGH (ref 35.0–45.0)
Hemoglobin: 15.6 g/dL — ABNORMAL HIGH (ref 11.7–15.5)
MCH: 29.7 pg (ref 27.0–33.0)
MCHC: 32.5 g/dL (ref 32.0–36.0)
MCV: 91.3 fL (ref 80.0–100.0)
MPV: 11.2 fL (ref 7.5–12.5)
Platelets: 184 Thousand/uL (ref 140–400)
RBC: 5.26 Million/uL — ABNORMAL HIGH (ref 3.80–5.10)
RDW: 13 % (ref 11.0–15.0)
WBC: 7.2 Thousand/uL (ref 3.8–10.8)

## 2024-05-08 LAB — LIPID PANEL
Cholesterol: 149 mg/dL (ref <200)
HDL: 24 mg/dL — ABNORMAL LOW (ref >=50)
LDL Cholesterol (Calc): 105 mg/dL — ABNORMAL HIGH
Non-HDL Cholesterol (Calc): 125 mg/dL (ref <130)
Total CHOL/HDL Ratio: 6.2 (calc) — ABNORMAL HIGH (ref <5.0)
Triglycerides: 105 mg/dL (ref <150)

## 2024-05-08 LAB — HEMOGLOBIN A1C
Hgb A1c MFr Bld: 5.6 % (ref <5.7)
Mean Plasma Glucose: 114 mg/dL
eAG (mmol/L): 6.3 mmol/L

## 2024-05-08 MED ORDER — SIMVASTATIN 20 MG PO TABS: 20.0000 mg | ORAL_TABLET | Freq: Every day | ORAL | 1 refills | Status: AC

## 2024-05-13 ENCOUNTER — Telehealth: Admitting: Physician Assistant

## 2024-05-13 DIAGNOSIS — H6690 Otitis media, unspecified, unspecified ear: Secondary | ICD-10-CM | POA: Diagnosis not present

## 2024-05-13 MED ORDER — AMOXICILLIN 875 MG PO TABS: 875.0000 mg | ORAL_TABLET | Freq: Two times a day (BID) | ORAL | 0 refills | Status: AC

## 2024-05-13 NOTE — Progress Notes (Signed)
 E-Visit for Ear Pain - Acute Otitis Media   We are sorry that you are not feeling well. Here is how we plan to help!  Based on what you have shared with me it looks like you have Acute Otitis Media.  Acute Otitis Media is an infection of the middle or "inner" ear. This type of infection can cause redness, inflammation, and fluid buildup behind the tympanic membrane (ear drum).  The usual symptoms include: Earache/Pain Fever Upper respiratory symptoms Lack of energy/Fatigue/Malaise Slight hearing loss gradually worsening- if the inner ear fills with fluid What causes middle ear infections? Most middle ear infections occur when an infection such as a cold, leads to a build-up of mucus in the middle ear and causes the Eustachian tube (a thin tube that runs from the middle ear to the back of the nose) to become swollen or blocked.   This means mucus can't drain away properly, making it easier for an infection to spread into the middle ear.  How middle ear infections are treated: Most ear infections clear up within three to five days and don't need any specific treatment. If necessary, tylenol or ibuprofen should be used to relieve pain and a high temperature.  If you develop a fever higher than 102, or any significantly worsening symptoms, this could indicate a more serious infection moving to the middle/inner and needs face to face evaluation in an office by a provider.   Antibiotics aren't routinely used to treat middle ear infections, although they may occasionally be prescribed if symptoms persist or are particularly severe. Given your presentation,   I have prescribed Amoxicillin 875 mg one tablet twice daily for 10 days   Your symptoms should improve over the next 3 days and should resolve in about 7 days. Be sure to complete ALL of the prescription(s) given.  HOME CARE: Wash your hands frequently. If you are prescribed an ear drop, do not place the tip of the bottle on your ear or  touch it with your fingers. You can take Acetaminophen 650 mg every 4-6 hours as needed for pain.  If pain is severe or moderate, you can apply a heating pad (set on low) or hot water bottle (wrapped in a towel) to outer ear for 20 minutes.  This will also increase drainage.  GET HELP RIGHT AWAY IF: Fever is over 102.2 degrees. You develop progressive ear pain or hearing loss. Ear symptoms persist longer than 3 days after treatment.  MAKE SURE YOU: Understand these instructions. Will watch your condition. Will get help right away if you are not doing well or get worse.  Thank you for choosing an e-visit.  Your e-visit answers were reviewed by a board certified advanced clinical practitioner to complete your personal care plan. Depending upon the condition, your plan could have included both over the counter or prescription medications.  Please review your pharmacy choice. Make sure the pharmacy is open so you can pick up the prescription now. If there is a problem, you may contact your provider through [employer] and have the prescription routed to another pharmacy.  Your safety is important to [place]. If you have drug allergies check your prescription carefully.   For the next 24 hours you can use MyChart to ask questions about today's visit, request a non-urgent call back, or ask for a work or school excuse. You will get an email with a survey after your eVisit asking about your experience. We would appreciate your feedback. I hope  that your e-visit has been valuable and will aid in your recovery.

## 2024-05-13 NOTE — Progress Notes (Signed)
 I have spent 5 minutes in review of e-visit questionnaire, review and updating patient chart, medical decision making and response to patient.   Elsie Velma Lunger, PA-C

## 2024-05-13 NOTE — Progress Notes (Signed)
 Message sent to patient requesting further input regarding current symptoms. Awaiting patient response.

## 2024-05-31 LAB — COLOGUARD: COLOGUARD: NEGATIVE

## 2024-06-04 ENCOUNTER — Encounter: Payer: Self-pay | Admitting: *Deleted

## 2024-06-18 ENCOUNTER — Other Ambulatory Visit: Payer: Self-pay | Admitting: Internal Medicine

## 2024-06-18 DIAGNOSIS — N63 Unspecified lump in unspecified breast: Secondary | ICD-10-CM

## 2024-07-01 ENCOUNTER — Encounter

## 2024-07-01 ENCOUNTER — Other Ambulatory Visit

## 2024-07-16 ENCOUNTER — Telehealth: Admitting: Physician Assistant

## 2024-07-16 DIAGNOSIS — B9789 Other viral agents as the cause of diseases classified elsewhere: Secondary | ICD-10-CM | POA: Diagnosis not present

## 2024-07-16 DIAGNOSIS — J019 Acute sinusitis, unspecified: Secondary | ICD-10-CM | POA: Diagnosis not present

## 2024-07-16 MED ORDER — IPRATROPIUM BROMIDE 0.03 % NA SOLN: 2.0000 | Freq: Two times a day (BID) | NASAL | 0 refills | Status: AC

## 2024-07-16 NOTE — Progress Notes (Signed)
 We are sorry that you are not feeling well.  Here is how we plan to help!  Based on what you have shared with me it looks like you have sinusitis.  Sinusitis is inflammation and infection in the sinus cavities of the head.  Based on your presentation I believe you most likely have Acute Viral Sinusitis.This is an infection most likely caused by a virus. There is not specific treatment for viral sinusitis other than to help you with the symptoms until the infection runs its course.  You may use an oral decongestant such as Mucinex D or if you have glaucoma or high blood pressure use plain Mucinex. Saline nasal spray help and can safely be used as often as needed for congestion, I have prescribed: Ipratropium Bromide nasal spray 0.03% 2 sprays in eah nostril 2-3 times a day  Some authorities believe that zinc sprays or the use of Echinacea may shorten the course of your symptoms.  Sinus infections are not as easily transmitted as other respiratory infection, however we still recommend that you avoid close contact with loved ones, especially the very young and elderly.  Remember to wash your hands thoroughly throughout the day as this is the number one way to prevent the spread of infection!  Home Care: Only take medications as instructed by your medical team. Do not take these medications with alcohol. A steam or ultrasonic humidifier can help congestion.  You can place a towel over your head and breathe in the steam from hot water coming from a faucet. Avoid close contacts especially the very young and the elderly. Cover your mouth when you cough or sneeze. Always remember to wash your hands.  Get Help Right Away If: You develop worsening fever or sinus pain. You develop a severe head ache or visual changes. Your symptoms persist after you have completed your treatment plan.  Make sure you Understand these instructions. Will watch your condition. Will get help right away if you are not doing  well or get worse.  Your e-visit answers were reviewed by a board certified advanced clinical practitioner to complete your personal care plan.  Depending on the condition, your plan could have included both over the counter or prescription medications.  If there is a problem please reply  once you have received a response from your provider.  Your safety is important to us .  If you have drug allergies check your prescription carefully.    You can use MyChart to ask questions about today's visit, request a non-urgent call back, or ask for a work or school excuse for 24 hours related to this e-Visit. If it has been greater than 24 hours you will need to follow up with your provider, or enter a new e-Visit to address those concerns.  You will get an e-mail in the next two days asking about your experience.  I hope that your e-visit has been valuable and will speed your recovery. Thank you for using e-visits.  I have spent 5 minutes in review of e-visit questionnaire, review and updating patient chart, medical decision making and response to patient.   Elsie Velma Lunger, PA-C

## 2024-11-04 ENCOUNTER — Ambulatory Visit: Admitting: Internal Medicine
